# Patient Record
Sex: Male | Born: 1965 | Race: White | Hispanic: No | Marital: Single | Smoking: Former smoker
Health system: Southern US, Community
[De-identification: ages and names within clinical notes are randomized; demographics above are authoritative.]

## PROBLEM LIST (undated history)

## (undated) DIAGNOSIS — T7840XA Allergy, unspecified, initial encounter: Secondary | ICD-10-CM

## (undated) DIAGNOSIS — I10 Essential (primary) hypertension: Secondary | ICD-10-CM

## (undated) DIAGNOSIS — J45909 Unspecified asthma, uncomplicated: Secondary | ICD-10-CM

## (undated) DIAGNOSIS — M1990 Unspecified osteoarthritis, unspecified site: Secondary | ICD-10-CM

## (undated) DIAGNOSIS — G4700 Insomnia, unspecified: Secondary | ICD-10-CM

## (undated) DIAGNOSIS — G4730 Sleep apnea, unspecified: Secondary | ICD-10-CM

## (undated) DIAGNOSIS — F419 Anxiety disorder, unspecified: Secondary | ICD-10-CM

## (undated) DIAGNOSIS — G4733 Obstructive sleep apnea (adult) (pediatric): Secondary | ICD-10-CM

## (undated) DIAGNOSIS — J309 Allergic rhinitis, unspecified: Secondary | ICD-10-CM

## (undated) DIAGNOSIS — L309 Dermatitis, unspecified: Secondary | ICD-10-CM

## (undated) DIAGNOSIS — Z9989 Dependence on other enabling machines and devices: Secondary | ICD-10-CM

## (undated) HISTORY — DX: Allergic rhinitis, unspecified: J30.9

## (undated) HISTORY — DX: Essential (primary) hypertension: I10

## (undated) HISTORY — DX: Sleep apnea, unspecified: G47.30

## (undated) HISTORY — DX: Obstructive sleep apnea (adult) (pediatric): Z99.89

## (undated) HISTORY — PX: SINOSCOPY: SHX187

## (undated) HISTORY — PX: OTHER SURGICAL HISTORY: SHX169

## (undated) HISTORY — PX: COLONOSCOPY: SHX174

## (undated) HISTORY — DX: Anxiety disorder, unspecified: F41.9

## (undated) HISTORY — DX: Dermatitis, unspecified: L30.9

## (undated) HISTORY — DX: Allergy, unspecified, initial encounter: T78.40XA

## (undated) HISTORY — PX: NOSE SURGERY: SHX723

## (undated) HISTORY — DX: Insomnia, unspecified: G47.00

## (undated) HISTORY — DX: Unspecified osteoarthritis, unspecified site: M19.90

## (undated) HISTORY — DX: Obstructive sleep apnea (adult) (pediatric): G47.33

---

## 2000-07-01 ENCOUNTER — Encounter: Admission: RE | Admit: 2000-07-01 | Discharge: 2000-07-01 | Payer: Self-pay | Admitting: Family Medicine

## 2000-07-01 ENCOUNTER — Encounter: Payer: Self-pay | Admitting: Family Medicine

## 2006-09-19 ENCOUNTER — Emergency Department (HOSPITAL_COMMUNITY): Admission: EM | Admit: 2006-09-19 | Discharge: 2006-09-19 | Payer: Self-pay | Admitting: Emergency Medicine

## 2007-09-15 ENCOUNTER — Ambulatory Visit: Payer: Self-pay | Admitting: Pulmonary Disease

## 2007-09-21 ENCOUNTER — Encounter: Payer: Self-pay | Admitting: Pulmonary Disease

## 2007-09-21 ENCOUNTER — Ambulatory Visit (HOSPITAL_BASED_OUTPATIENT_CLINIC_OR_DEPARTMENT_OTHER): Admission: RE | Admit: 2007-09-21 | Discharge: 2007-09-21 | Payer: Self-pay | Admitting: Pulmonary Disease

## 2007-10-05 ENCOUNTER — Ambulatory Visit: Payer: Self-pay | Admitting: Pulmonary Disease

## 2007-10-06 ENCOUNTER — Ambulatory Visit: Payer: Self-pay | Admitting: Internal Medicine

## 2007-10-06 DIAGNOSIS — G4700 Insomnia, unspecified: Secondary | ICD-10-CM | POA: Insufficient documentation

## 2007-10-06 DIAGNOSIS — E7800 Pure hypercholesterolemia, unspecified: Secondary | ICD-10-CM | POA: Insufficient documentation

## 2007-10-06 DIAGNOSIS — F17200 Nicotine dependence, unspecified, uncomplicated: Secondary | ICD-10-CM | POA: Insufficient documentation

## 2007-10-06 DIAGNOSIS — E291 Testicular hypofunction: Secondary | ICD-10-CM | POA: Insufficient documentation

## 2007-10-06 LAB — CONVERTED CEMR LAB
ALT: 61 units/L — ABNORMAL HIGH (ref 0–53)
AST: 37 units/L (ref 0–37)
Albumin: 4.1 g/dL (ref 3.5–5.2)
Alkaline Phosphatase: 72 units/L (ref 39–117)
Amphetamine Screen, Ur: NEGATIVE
BUN: 7 mg/dL (ref 6–23)
Bacteria, UA: NEGATIVE
Barbiturate Quant, Ur: NEGATIVE
Basophils Absolute: 0.1 10*3/uL (ref 0.0–0.1)
Basophils Relative: 0.6 % (ref 0.0–3.0)
Benzodiazepines.: NEGATIVE
Bilirubin Urine: NEGATIVE
Bilirubin, Direct: 0.1 mg/dL (ref 0.0–0.3)
CO2: 29 meq/L (ref 19–32)
Calcium: 9.1 mg/dL (ref 8.4–10.5)
Chloride: 104 meq/L (ref 96–112)
Cocaine Metabolites: NEGATIVE
Creatinine, Ser: 0.9 mg/dL (ref 0.4–1.5)
Creatinine,U: 201.7 mg/dL
Crystals: NEGATIVE
Eosinophils Absolute: 0.1 10*3/uL (ref 0.0–0.7)
Eosinophils Relative: 0.5 % (ref 0.0–5.0)
GFR calc Af Amer: 118 mL/min
GFR calc non Af Amer: 97 mL/min
Glucose, Bld: 94 mg/dL (ref 70–99)
HCT: 45.7 % (ref 39.0–52.0)
Hemoglobin, Urine: NEGATIVE
Hemoglobin: 16.3 g/dL (ref 13.0–17.0)
Ketones, ur: NEGATIVE mg/dL
Leukocytes, UA: NEGATIVE
Lymphocytes Relative: 30.3 % (ref 12.0–46.0)
MCHC: 35.8 g/dL (ref 30.0–36.0)
MCV: 93.7 fL (ref 78.0–100.0)
Marijuana Metabolite: NEGATIVE
Methadone: NEGATIVE
Monocytes Absolute: 0.5 10*3/uL (ref 0.1–1.0)
Monocytes Relative: 4.9 % (ref 3.0–12.0)
Neutro Abs: 6.5 10*3/uL (ref 1.4–7.7)
Neutrophils Relative %: 63.7 % (ref 43.0–77.0)
Nitrite: NEGATIVE
Opiates: NEGATIVE
Phencyclidine (PCP): NEGATIVE
Platelets: 325 10*3/uL (ref 150–400)
Potassium: 4 meq/L (ref 3.5–5.1)
Propoxyphene: NEGATIVE
RBC: 4.88 M/uL (ref 4.22–5.81)
RDW: 12.1 % (ref 11.5–14.6)
Sodium: 142 meq/L (ref 135–145)
Specific Gravity, Urine: 1.02 (ref 1.000–1.03)
Squamous Epithelial / HPF: NEGATIVE /lpf
TSH: 0.97 microintl units/mL (ref 0.35–5.50)
Total Bilirubin: 0.9 mg/dL (ref 0.3–1.2)
Total Protein, Urine: NEGATIVE mg/dL
Total Protein: 7.7 g/dL (ref 6.0–8.3)
Urine Glucose: NEGATIVE mg/dL
Urobilinogen, UA: 0.2 (ref 0.0–1.0)
WBC: 10.4 10*3/uL (ref 4.5–10.5)
pH: 7 (ref 5.0–8.0)

## 2007-10-07 ENCOUNTER — Encounter: Payer: Self-pay | Admitting: Internal Medicine

## 2007-10-09 DIAGNOSIS — F3289 Other specified depressive episodes: Secondary | ICD-10-CM | POA: Insufficient documentation

## 2007-10-09 DIAGNOSIS — F329 Major depressive disorder, single episode, unspecified: Secondary | ICD-10-CM | POA: Insufficient documentation

## 2007-10-14 ENCOUNTER — Ambulatory Visit: Payer: Self-pay | Admitting: Licensed Clinical Social Worker

## 2007-10-19 ENCOUNTER — Encounter: Payer: Self-pay | Admitting: Internal Medicine

## 2007-10-19 DIAGNOSIS — R748 Abnormal levels of other serum enzymes: Secondary | ICD-10-CM | POA: Insufficient documentation

## 2007-10-20 ENCOUNTER — Ambulatory Visit: Payer: Self-pay | Admitting: Internal Medicine

## 2007-10-20 LAB — CONVERTED CEMR LAB
ALT: 71 units/L — ABNORMAL HIGH (ref 0–53)
AST: 45 units/L — ABNORMAL HIGH (ref 0–37)
Albumin: 3.9 g/dL (ref 3.5–5.2)
Alkaline Phosphatase: 71 units/L (ref 39–117)
Bilirubin, Direct: 0.1 mg/dL (ref 0.0–0.3)
Cholesterol, target level: 200 mg/dL
HCV Ab: NEGATIVE
HDL goal, serum: 40 mg/dL
Hep A Total Ab: NEGATIVE
Hep B Core Total Ab: NEGATIVE
Hep B S Ab: NEGATIVE
Hepatitis B Surface Ag: NEGATIVE
LDL Goal: 160 mg/dL
Total Bilirubin: 0.8 mg/dL (ref 0.3–1.2)
Total Protein: 7.6 g/dL (ref 6.0–8.3)

## 2007-10-22 ENCOUNTER — Encounter: Payer: Self-pay | Admitting: Internal Medicine

## 2007-10-26 ENCOUNTER — Ambulatory Visit: Payer: Self-pay | Admitting: Internal Medicine

## 2007-10-26 ENCOUNTER — Telehealth (INDEPENDENT_AMBULATORY_CARE_PROVIDER_SITE_OTHER): Payer: Self-pay | Admitting: *Deleted

## 2007-11-02 ENCOUNTER — Encounter: Admission: RE | Admit: 2007-11-02 | Discharge: 2007-11-02 | Payer: Self-pay | Admitting: Internal Medicine

## 2007-11-02 ENCOUNTER — Encounter: Payer: Self-pay | Admitting: Internal Medicine

## 2007-11-15 ENCOUNTER — Ambulatory Visit: Payer: Self-pay | Admitting: Pulmonary Disease

## 2007-11-15 DIAGNOSIS — G4733 Obstructive sleep apnea (adult) (pediatric): Secondary | ICD-10-CM | POA: Insufficient documentation

## 2007-11-16 ENCOUNTER — Ambulatory Visit: Payer: Self-pay | Admitting: Internal Medicine

## 2007-12-09 ENCOUNTER — Encounter: Payer: Self-pay | Admitting: Pulmonary Disease

## 2008-01-30 ENCOUNTER — Telehealth (INDEPENDENT_AMBULATORY_CARE_PROVIDER_SITE_OTHER): Payer: Self-pay | Admitting: *Deleted

## 2008-02-28 ENCOUNTER — Ambulatory Visit: Payer: Self-pay | Admitting: Pulmonary Disease

## 2008-04-16 ENCOUNTER — Ambulatory Visit: Payer: Self-pay | Admitting: Pulmonary Disease

## 2008-04-16 ENCOUNTER — Ambulatory Visit: Payer: Self-pay | Admitting: Internal Medicine

## 2008-04-16 DIAGNOSIS — M545 Low back pain, unspecified: Secondary | ICD-10-CM | POA: Insufficient documentation

## 2008-04-16 LAB — CONVERTED CEMR LAB
ALT: 60 units/L — ABNORMAL HIGH (ref 0–53)
AST: 38 units/L — ABNORMAL HIGH (ref 0–37)
Albumin: 3.9 g/dL (ref 3.5–5.2)
Alkaline Phosphatase: 66 units/L (ref 39–117)
Bilirubin, Direct: 0.2 mg/dL (ref 0.0–0.3)
Testosterone: 266.67 ng/dL — ABNORMAL LOW (ref 350.00–890.00)
Total Bilirubin: 0.8 mg/dL (ref 0.3–1.2)
Total Protein: 7.6 g/dL (ref 6.0–8.3)

## 2008-04-20 ENCOUNTER — Telehealth: Payer: Self-pay | Admitting: Internal Medicine

## 2008-04-25 ENCOUNTER — Telehealth: Payer: Self-pay | Admitting: Internal Medicine

## 2008-05-17 ENCOUNTER — Ambulatory Visit: Payer: Self-pay | Admitting: Pulmonary Disease

## 2008-05-18 ENCOUNTER — Ambulatory Visit: Payer: Self-pay | Admitting: Internal Medicine

## 2008-07-20 ENCOUNTER — Ambulatory Visit: Payer: Self-pay | Admitting: Pulmonary Disease

## 2008-07-26 ENCOUNTER — Telehealth: Payer: Self-pay | Admitting: Pulmonary Disease

## 2008-09-18 ENCOUNTER — Ambulatory Visit: Payer: Self-pay | Admitting: Pulmonary Disease

## 2008-09-20 ENCOUNTER — Telehealth: Payer: Self-pay | Admitting: Pulmonary Disease

## 2008-10-16 ENCOUNTER — Ambulatory Visit: Payer: Self-pay | Admitting: Pulmonary Disease

## 2008-11-22 ENCOUNTER — Encounter: Payer: Self-pay | Admitting: Internal Medicine

## 2009-02-13 ENCOUNTER — Encounter: Payer: Self-pay | Admitting: Pulmonary Disease
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
## 2010-02-04 NOTE — Letter (Signed)
Summary: Results Follow-up Letter  [facility]  [address] [place], [place] [address]   Phone: [phone]  Fax: [phone]    11/02/2007  [address] [place], [place]  [address]  Dear Richard Reyes,   The following are the results of your recent test(s):  Test       Result     Liver Ultrasound     Fatty liver disease  _________________________________________________________  Please call for an appointment as directed _________________________________________________________ _________________________________________________________ _________________________________________________________  Sincerely,  Sanda Linger MD [facility]

## 2010-02-04 NOTE — Progress Notes (Signed)
Summary: results  Phone Note Call from Patient Call back at [phone]   Caller: Patient Call For: sood Summary of Call: pt calling for sleep study results Initial call taken by: Rickard Patience,  October 26, 2007 2:30 PM  Follow-up for Phone Call        VS, do you have these results yet? thanks.  Follow-up by: Reynaldo Minium CMA,  October 26, 2007 3:11 PM  Additional Follow-up for Phone Call Additional follow up Details #1::        Pt needs ov to discuss results of sleep study. Michel Bickers CMA  October 26, 2007 3:22 PM  OV w/ Craige Cotta on 11-04-07 to review sleep results. Michel Bickers [facility]  October 26, 2007 4:03 PM

## 2010-02-04 NOTE — Progress Notes (Signed)
Summary: REFERRAL  Phone Note Call from Patient Call back at [phone]   Caller: Patient Call For: Giavonna Pflum Summary of Call: PT NEED TO SPEAK TO NURSE ABOUT BEING REFERRED TO ANOTHER SLEEP DR Initial call taken by: Rickard Patience,  September 20, 2008 1:48 PM  Follow-up for Phone Call        Pt says the Alfonso Patten did not help him last night and Dr. Craige Cotta had said he would refer him elsewhere. He is going to callback with the name of another sleep doctor that does a special therapy, so we may ask Dr. Craige Cotta what he thinks. If this is not appropriate for him, he is willing to be referred to [facility] or [facility]. Michel Bickers [facility]  September 20, 2008 3:40 PM  Additional Follow-up for Phone Call Additional follow up Details #1::        will await call bcak. Carron Curie CMA  September 21, 2008 9:46 AM

## 2010-02-04 NOTE — Progress Notes (Signed)
Summary: talk to [facility] x1  Phone Note Call from Patient Call back at Work Phone [phone]   Caller: Patient Call For: SOOD Reason for Call: Talk to Doctor Summary of Call: patient wants to talk to dr sood about the meds that he is on. he is having some of the side effects that he read about from taking some of the meds. he said he feels like a "druggie".  Initial call taken by: Valinda Hoar,  July 26, 2008 1:05 PM  Follow-up for Phone Call        [facility] Vernie Murders  July 26, 2008 2:01 PM  Spoke with pt.  He states that he is afraid to start taking clonazepam due to side effects that he read about.  He states that he read about having anxiety, hair loss, and dizziness.  He states that he already has these problems and does not want to make them worse.  Has not tried taking med yet.  Wants to try something with less s/e.  Please advise thanks!  Follow-up by: Vernie Murders,  July 26, 2008 3:10 PM  Additional Follow-up for Phone Call Additional follow up Details #1::        Explained to patient that the side effects he was concerned about while possible are not very common.  He was concerned about hair loss, dry mouth, blurred vision.  I advised him to try using the medicine, and if he notices any side effects to call me for further instructions. Additional Follow-up by: Coralyn Helling MD,  July 26, 2008 3:48 PM

## 2010-02-04 NOTE — Letter (Signed)
Summary: CMN for [facility]AP [facility]  CMN for [facility]   Imported By: Lennie Odor 02/18/2009 14:23:59  _____________________________________________________________________  External Attachment:    Type:   Image     Comment:   External Document

## 2010-02-04 NOTE — Assessment & Plan Note (Signed)
Summary: 2 week fu/$50/jss      Current Allergies: ! SULFA        Complete Medication List: 1)  Trazodone Hcl 50 Mg Tabs (Trazodone hcl) .... Take one tablet at bedtime 2)  Lunesta 3 Mg Tabs (Eszopiclone) 3)  Restoril 30 Mg Caps (Temazepam) 4)  Doxycycline Hyclate 100 Mg Tabs (Doxycycline hyclate) 5)  Symbyax 6-25 Mg Caps (Olanzapine-fluoxetine hcl) .... Once daily at 7pm    ]

## 2010-02-04 NOTE — Progress Notes (Signed)
Summary: Medication  Phone Note Call from Patient   Summary of Call: See previous phone note. Pt is willing to trying to a different medication. Would you like to add new med?  Initial call taken by: Lamar Sprinkles,  April 25, 2008 9:45 AM  Follow-up for Phone Call        I need the pt. to tell me about all of the previous anti-depressants he has taken and how he responded to each Follow-up by: Etta Grandchild MD,  April 25, 2008 9:48 AM  Additional Follow-up for Phone Call Additional follow up Details #1::        left mess to call office back.......................Marland KitchenLamar Sprinkles  May 02, 2008 6:27 PM     Additional Follow-up for Phone Call Additional follow up Details #2::    Multiple attempts, no call back, will sign off on phone note Follow-up by: Lamar Sprinkles,  May 07, 2008 5:54 PM

## 2010-02-04 NOTE — Assessment & Plan Note (Signed)
Summary: rov/discuss sleep results/apc   PCP:  Sanda Linger  Chief Complaint:  Follow up sleep study.  History of Present Illness: I saw Richard Reyes in follow up today to review his sleep test.  This was done on September 21, 2007.  This showed evidence for mild to moderate sleep apnea with an AHI of 14 and oxygen saturation nadir of 86%.  Of note is that he took at total of 15 mg of lunesta on the night of the study.     Current Allergies: ! SULFA  Past Medical History:    Dyslipidemia - diet controlled    Depression    Low testosterone    OSA, PSG 09/21/07 AHI 14      Vital Signs:  Patient Profile:   45 Years Old Male Height:     69 inches Weight:      215 pounds O2 Sat:      95 % O2 treatment:    Room Air Temp:     98.1 degrees F oral Pulse rate:   104 / minute BP sitting:   110 / 60  (left arm)  Vitals Entered By: Renold Genta RCP, LPN (November 15, 2007 3:18 PM)             Is Patient Diabetic? No Comments Follow up sleep study results, pt os still smoking 4 per day Medications reviewed with patient Renold Genta RCP, LPN  November 15, 2007 3:25 PM          Impression & Recommendations:  Problem # 1:  OBSTRUCTIVE SLEEP APNEA (ICD-327.23) I reviewed his sleep test results.  The health consequences of untreated sleep apnea were explained.  The importance of driving precautions and weight reduction were reviewed.  I discussed the treatment options for sleep apnea.  He will start on auto-CPAP.  Depending on his response to this he may need to have an in-lab titration study.   Problem # 2:  INSOMNIA (ICD-780.52) He is taking several sleep aides, and is worried about the potential long term effects, as well as possible addictive potential.  He however is worried that he will still need something to help him initiate and maintain sleep while he is weaning of his other sleep aides.  I will start him on zolpidem 10 mg at bedtime as needed.  He is to then  decrease his dose of restoril to 15 mg at bedtime.  Once he is okay with this regimen, then he will further decrease the dose of restoril to 15 mg every other day while using ambien as needed.  After becoming established on this pattern he will then stop using restoril, and only use ambien as needed.  We will then work on weaning him off of [name].  Problem # 3:  DEPRESSION (ICD-311) He is concerned about the way Symbyax is making him feel.  I advised him to follow up with his primary care physician to determine if adjustments to his psychiatric medications are needed.  Medications Added to Medication List This Visit: 1)  Restoril 30 Mg Caps (Temazepam) .Marland Kitchen.. 1 capsule at bedtime as needed 2)  Trazamine 50 Mg Misc (Trazodone & diet manage prod) .Marland Kitchen.. 1-2 tablets as needed at bedtime 3)  Zolpidem Tartrate 10 Mg Tabs (Zolpidem tartrate) .... One by mouth at bedtime as needed  Complete Medication List: 1)  Symbyax 6-25 Mg Caps (Olanzapine-fluoxetine hcl) .... Once daily at 7pm 2)  Restoril 30 Mg Caps (Temazepam) .Marland Kitchen.. 1 capsule at bedtime as needed  3)  Trazamine 50 Mg Misc (Trazodone & diet manage prod) .Marland Kitchen.. 1-2 tablets as needed at bedtime 4)  Zolpidem Tartrate 10 Mg Tabs (Zolpidem tartrate) .... One by mouth at bedtime as needed   Patient Instructions: 1)  Ambien/Zolpidem 10 mg at bedtime as needed 2)  Will set up CPAP machine at home 3)  Follow up in 8 weeks   Prescriptions: ZOLPIDEM TARTRATE 10 MG TABS (ZOLPIDEM TARTRATE) one by mouth at bedtime as needed  #30 x 0   Entered and Authorized by:   Coralyn Helling MD   Signed by:   Coralyn Helling MD on 11/15/2007   Method used:   Print then Give to Patient   RxID:   [id number]  ]
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
## 2010-02-04 NOTE — Letter (Signed)
Summary: [facility]  [facility]   Imported By: Sherian Rein 12/07/2008 09:19:35  _____________________________________________________________________  External Attachment:    Type:   Image     Comment:   External Document

## 2010-02-04 NOTE — Letter (Signed)
Summary: Results Follow-up Letter  [facility]  [address] [place], [place] [address]   Phone: [phone]  Fax: [phone]    04/16/2008  [address] [place], [place]  [address]  Dear Mr. Hargadon,   The following are the results of your recent test(s):  Test     Result     Liver enzymes   mildly elevated Testosterone   low   _________________________________________________________  Please call for an appointment as directed _________________________________________________________ _________________________________________________________ _________________________________________________________  Sincerely,  Sanda Linger MD [facility]

## 2010-02-04 NOTE — Letter (Signed)
Summary: Results Follow-up Letter  [facility]  [address] [place], [place] [address]   Phone: [phone]  Fax: [phone]    10/22/2007  [address] [place], [place]  [address]  Dear Richard Reyes,   The following are the results of your recent test(s):  Test     Result     the liver enzymes remain elevated. Actually they're a little higher than last time. All the tests for viral forms of hepatitis including A, B, and C are negative. This is good news. However, it doesmean that you are at risk of contracting hepatitis A, B and C. We have a vaccine for A and B which I would recommend you start as soon as possible. We don't have a vaccine for hepatitis C.   _________________________________________________________  Please call for an appointment soon _________________________________________________________ _________________________________________________________ _________________________________________________________  Sincerely,  Sanda Linger MD [facility]

## 2010-02-04 NOTE — Progress Notes (Signed)
Summary: DEPRESSION  Phone Note Outgoing Call    Follow-up for Phone Call        I got a note from Dr. Craige Cotta that Mr. Richard Reyes is depressed, please ask him to restart the Symbyax Follow-up by: Etta Grandchild MD,  April 20, 2008 12:45 PM  Additional Follow-up for Phone Call Additional follow up Details #1::        left mess to call office back .................Marland KitchenLamar Sprinkles  April 20, 2008 3:57 PM   no answer on hm #, left mess to call office back on work #.........................Marland KitchenLamar Sprinkles  April 23, 2008 4:02 PM     Additional Follow-up for Phone Call Additional follow up Details #2::    Patient returned call and was given MD advisement.  He states that the medication was d/c in the past due to side effects and knee pain. Follow-up by: Rock Nephew CMA,  April 24, 2008 3:35 PM  New/Updated Medications: SYMBYAX 6-25 MG CAPS (OLANZAPINE-FLUOXETINE HCL) one qday at about 7;30 pm   Prescriptions: SYMBYAX 6-25 MG CAPS (OLANZAPINE-FLUOXETINE HCL) one qday at about 7;30 pm  #30 x 9   Entered and Authorized by:   Etta Grandchild MD   Signed by:   Etta Grandchild MD on 04/20/2008   Method used:   Electronically to        [facility]  [id number]* (retail)       [address]       [place], [place]  [address]       Ph: [phone] or [phone]       Fax: [phone]   RxID:   [id number]
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
## 2010-02-04 NOTE — Assessment & Plan Note (Signed)
Summary: 1 onth return/mh   Primary Provider/Referring Provider:  Sanda Linger  CC:  1 month follow-up visit.  Pt states sleeping some better and but still not sleeping well.  Still has difficulty falling asleep with CPAP on.  CPAP makes a whistling sound sometimes at night.  .  History of Present Illness: I saw Richard Reyes in follow up for his OSA and insomnia.  He goes to bed at midnight, and wakes up at 4 am.  He has tried experimenting with different combinations of lorazepam and trazodone.  He is sleeping slightly better.  He is not sleeping during the day.  He has been tolerating CPAP better.  His mask has been making a whistling noise.  He feels his thoughts are clear, but he is still forgetful at times.  He gets yellow color in his eyes in the morning, but goes away during the day.   Current Medications (verified): 1)  Trazodone Hcl 150 Mg Tabs (Trazodone Hcl) .... One By Mouth At Bedtime As Needed 2)  Lorazepam 1 Mg Tabs (Lorazepam) .... One By Mouth At Bedtime As Needed  Allergies (verified): 1)  ! Sulfa  Vital Signs:  Patient profile:   45 year old male Weight:      209.25 pounds O2 Sat:      94 % Temp:     98.0 degrees F oral Pulse rate:   80 / minute BP sitting:   114 / 74  (left arm)  Vitals Entered By: Richard Reams RN (May 17, 2008 9:19 AM)  O2 Sat at Rest %:  94 O2 Flow:  room air  Physical Exam  Eyes:  no scleral icterus   Impression & Recommendations:  Problem # 1:  OBSTRUCTIVE SLEEP APNEA (ICD-327.23) He seems to be tolerating CPAP better.  He will contact his DME about checking his mask fit.  I will get a copy of his CPAP download, and call him with the results.  Problem # 2:  INSOMNIA (ICD-780.52) His sleep pattern seems to be improved some.  He is to continue on lorazepam and trazodone for now.  Once his sleep pattern is stable, will then try to work on tapering down his sleep aide regimen.  Complete Medication List: 1)  Trazodone Hcl 150 Mg  Tabs (Trazodone hcl) .... One by mouth at bedtime as needed 2)  Lorazepam 1 Mg Tabs (Lorazepam) .... One by mouth at bedtime as needed  Other Orders: DME Referral (DME) Est. Patient Level II (16109)  Patient Instructions: 1)  Follow up in 3 to 4 months
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
## 2010-02-04 NOTE — Progress Notes (Signed)
Summary: Ambien refill  Phone Note Call from Patient Call back at [phone]   Summary of Call: [facility] needs refill to last until appt on 2/16 with dr sood had to resc due to dr sood emergancy  Initial call taken by: Lacinda Axon,  January 30, 2008 12:58 PM  Follow-up for Phone Call        Per Chantel pt needs refill on Zolpidem 10mg  tablets. See electronic rx.  Called in refill to pharmacy as requested.  Attempted to notify pt.  LMOM rx refill sent to pharmacy.  Follow-up by: Cloyde Reams RN,  January 30, 2008 3:43 PM      Prescriptions: ZOLPIDEM TARTRATE 10 MG TABS (ZOLPIDEM TARTRATE) one by mouth at bedtime as needed  #30 x 0   Entered by:   Cloyde Reams RN   Authorized by:   Coralyn Helling MD   Signed by:   Cloyde Reams RN on 01/30/2008   Method used:   Telephoned to ...       [facility] [id number] (retail)       [address]       [place], [place]  [address]       Ph: [phone] or [phone]       Fax: [phone]   RxID:   [id number]

## 2010-02-04 NOTE — Letter (Signed)
Summary: Results Follow-up Letter  [facility]  [address] [place], [place] [address]   Phone: [phone]  Fax: [phone]    10/07/2007  [address] [place], [place]  [address]  Dear Mr. Wachter,   The following are the results of your recent test(s):  Test     Result     complete blood count   normal Kidney function     normal Liver enzymes     very mildly elevated Thyroid function     normal Urinalysis       normal   _________________________________________________________  Please call for an appointment in 2 weeks _________________________________________________________ _________________________________________________________ _________________________________________________________  Sincerely,  Sanda Linger MD [facility]
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
## 2010-02-04 NOTE — Assessment & Plan Note (Signed)
Summary: ROV/MBW   PCP:  Richard Reyes  Chief Complaint:  fu visit.....Marland Kitchenambien not working...Marland KitchenMarland KitchenMarland Kitchenreviewed meds.  History of Present Illness: I saw Richard Reyes in follow up for his OSA and insomnia.  He is currently using a full face mask with a humidifer.  He gets dryness in his nose and mouth.  He uses the machine for about 4 hours.  It will some times come off in the middle of the night.  He is not sure if it helps.  He has not been using his machine for the past several days because he just had dental work.  He has unusual behaviors when he uses [name].  He says that he will call people, smoke, drink alcohol, and do other activities with being aware of this when he takes [name].  He is not aware of these when they occur, but finds out later from others.  He goes to bed at 930 pm.  He will sometimes take symbyax, but this does not help him fall asleep.  He wakes up between 5 and 7 am.  He says that if he does not use anything to sleep he will stay awake for a week.  Interestingly, he says his mother has told him that he sleeps more than he realizes.     Current Allergies: ! SULFA Past Medical History:    Dyslipidemia - diet controlled    Depression    Low testosterone    OSA, PSG 09/21/07 AHI 14    Insomnia   Vital Signs:  Patient Profile:   45 Years Old Male Height:     69 inches Weight:      213.25 pounds BMI:     31.61 O2 Sat:      95 % O2 treatment:    Room Air Temp:     98.2 degrees F oral Pulse rate:   83 / minute BP sitting:   120 / 70  (left arm) Cuff size:   regular  Pt. in pain?   no  Vitals Entered By: Richard Reyes) (February 28, 2008 4:35 PM)                  Impression & Recommendations:  Problem # 1:  OBSTRUCTIVE SLEEP APNEA (ICD-327.23) He will speak with his DME about his mask fit, and adjusting his humidifer.  He will also drop off his CPAP download, and then will decide if further adjustments needed. Orders: Est. Patient Level II  (98119)   Problem # 2:  INSOMNIA (ICD-780.52) He had confusional arousals with ambien.  He says he is no longer using restoril.  He uses symbyax, but only sporadically.  I will start him on trazodone 150 mg at bedtime.  I have discussed with him the possible side effects of trazodone. Orders: Est. Patient Level II (14782)   Medications Added to Medication List This Visit: 1)  Trazodone Hcl 150 Mg Tabs (Trazodone hcl) .... One by mouth at bedtime as needed  Complete Medication List: 1)  Symbyax 6-25 Mg Caps (Olanzapine-fluoxetine hcl) .... Once daily at 7pm 2)  Trazodone Hcl 150 Mg Tabs (Trazodone hcl) .... One by mouth at bedtime as needed  Patient Instructions: 1)  Trazodone 150 mg at bedtime as needed 2)  Follow up in 6 weeks Prescriptions: TRAZODONE HCL 150 MG TABS (TRAZODONE HCL) one by mouth at bedtime as needed  #30 x 2   Entered and Authorized by:   Richard Helling MD   Signed by:   Richard Nancy  Nyiah Pianka MD on 02/28/2008   Method used:   Electronically to        [id number] (retail)       [address]       [place], [place]  [address]       Ph: [phone] or [phone]       Fax: [phone]   RxID:   [id number]

## 2010-05-20 NOTE — Procedures (Signed)
NAME:  Richard Reyes, Richard Reyes              ACCOUNT NO.:  [id number]   MEDICAL RECORD NO.:  [id number]          PATIENT TYPE:  OUT   LOCATION:  SLEEP CENTER                 FACILITY[facility]  [facility]   PHYSICIAN:  Coralyn Helling, MD        DATE OF BIRTH:  Oct 13, 1965   DATE OF STUDY:  09/21/2007                            NOCTURNAL POLYSOMNOGRAM   REFERRING PHYSICIAN:   REFERRING PHYSICIAN:  Coralyn Helling, M.D.   INDICATION FOR STUDY:  [name] is a 45 year old male who has a  history of sleep onset and sleep maintenance insomnia with sleep  disruption and excessive daytime sleepiness.  He is referred to the  sleep lab for evaluation of this as well as also to evaluate obstructive  sleep apnea.   Height is 5 feet 9 inches.  Weight is 203 pounds.  BMI is 30.  Neck size  is 15 inches.   EPWORTH SLEEPINESS SCORE:  2.   MEDICATIONS:  Trazodone and Lunesta.  The patient took one Lunesta 3 mg  at 8:45 p.m., then took a total of 6 mg of Lunesta at 9:30 p.m.  Then  another 6 mg of Lunesta at 10:30 p.m.   SLEEP ARCHITECTURE:  Total recording time was 439 minutes.  Total sleep  time was 280 minutes.  Sleep efficiency was 64%, which is reduced.  Sleep latency was 24 minutes, which is prolonged.  REM latency was 227  minutes, which is prolonged.  The study was notable for lack of slow  wave sleep, and he slept predominantly in the nonsupine position.  Of  note is that it appeared that he had difficulties with sleep initiation  and sleep maintenance due to respiratory events.   RESPIRATORY DATA:  The average respiratory rate was 22.  The overall  apnea-hypopnea index was 14.  There was one central apneic event.  The  remainder of the events were obstructive in nature.  The REM apnea-  hypopnea index was 30.  The non-REM apnea-hypopnea index was 10.  Moderate snoring was noted by the technician.   OXYGEN DATA:  The baseline oxygenation was 96%.  The oxygen saturation  nadir was 86%.  The patient spent a  total of 5 minutes with an oxygen  saturation of less than 90%, and 1.2 minutes with an oxygen saturation  of less than 88%.   CARDIAC DATA:  The average heart rate was 72.  The rhythm strip showed a  normal sinus rhythm.   MOVEMENT-PARASOMNIA:  The patient had two restroom trips.  The periodic  limb movement index was 1.7.   IMPRESSIONS-RECOMMENDATIONS:  This study shows evidence for mild-to-  moderate obstructive sleep apnea, as demonstrated by an apnea-hypopnea  index of 14 and an oxygen saturation nadir of 86%.  Of note is that he  appeared to have difficulty with sleep initiation and sleep maintenance  due to respiratory events.   In addition to counseling with regards to the importance of valid  exercise and weight reduction, further therapeutic interventions could  include either CPAP therapy, oral appliance, or surgical intervention.   The patient also should receive further counseling with regards to the  proper  use of sleep aids.      Coralyn Helling, MD  Diplomat, American Board of Sleep Medicine  Electronically Signed     VS/MEDQ  D:  10/05/2007 17:34:24  T:  10/05/2007 19:17:45  Job:  [id number]

## 2013-05-17 ENCOUNTER — Ambulatory Visit (INDEPENDENT_AMBULATORY_CARE_PROVIDER_SITE_OTHER): Payer: BC Managed Care – PPO | Admitting: Family Medicine

## 2013-05-17 VITALS — BP 122/90 | HR 106 | Temp 98.0°F | Resp 18 | Ht 68.75 in | Wt 211.0 lb

## 2013-05-17 DIAGNOSIS — Z733 Stress, not elsewhere classified: Secondary | ICD-10-CM

## 2013-05-17 DIAGNOSIS — F439 Reaction to severe stress, unspecified: Secondary | ICD-10-CM

## 2013-05-17 DIAGNOSIS — G4700 Insomnia, unspecified: Secondary | ICD-10-CM

## 2013-05-17 MED ORDER — CLONAZEPAM 0.5 MG PO TABS: 0.5000 mg | ORAL_TABLET | Freq: Two times a day (BID) | ORAL | Status: DC | PRN

## 2013-05-17 NOTE — Progress Notes (Signed)
Chief Complaint:  Chief Complaint  Patient presents with  . Insomnia    pt wants a referral for a sleep study    HPI: Richard Reyes is a 48 y.o. male who is here for insomnia sxs, he states that in the past he had lunesta and it worked well for him until it stopped working and had the opposite affect and he ended up not sleeping. He never had roblems with going to sleep or maintaining sleep per the patient, he had problesm with  He recently was let go and is gittery and worried about payingth ebillsm has incurance for the next 3 months. He has a couple of lorazepam that he had from an urgent care [facility] [place] and also sleep issues  NO SI/HI/halluciantions/bipolar disorder  Past Medical History  Diagnosis Date  . Anxiety    History reviewed. No pertinent past surgical history. History   Social History  . Marital Status: Single    Spouse Name: N/A    Number of Children: N/A  . Years of Education: N/A   Social History Main Topics  . Smoking status: Former Smoker    Quit date: 05/18/2007  . Smokeless tobacco: None  . Alcohol Use: No  . Drug Use: No  . Sexual Activity: None   Other Topics Concern  . None   Social History Narrative  . None   Family History  Problem Relation Age of Onset  . Cancer Maternal Grandmother    Allergies  Allergen Reactions  . Sulfonamide Derivatives    Prior to Admission medications   Medication Sig Start Date End Date Taking? Authorizing Provider  fluconazole (DIFLUCAN) 200 MG tablet Take 200 mg by mouth daily.   Yes Historical Provider, MD  LORazepam (ATIVAN) 0.5 MG tablet Take 0.5 mg by mouth every 8 (eight) hours.   Yes Historical Provider, MD     ROS: The patient denies fevers, chills, night sweats, unintentional weight loss, chest pain, palpitations, wheezing, dyspnea on exertion, nausea, vomiting, abdominal pain, dysuria, hematuria, melena, numbness, weakness, or tingling.   All other systems  have been reviewed and were otherwise negative with the exception of those mentioned in the HPI and as above.    PHYSICAL EXAM: Filed Vitals:   05/17/13 1117  BP: 122/90  Pulse: 106  Temp: 98 F (36.7 C)  Resp: 18   Filed Vitals:   05/17/13 1117  Height: 5' 8.75" (1.746 m)  Weight: 211 lb (95.709 kg)   Body mass index is 31.4 kg/(m^2).  General: Alert, no acute distress, talkative white male HEENT:  Normocephalic, atraumatic, oropharynx patent. EOMI, PERRLA Cardiovascular:  Regular rate and rhythm, no rubs murmurs or gallops.  No Carotid bruits, radial pulse intact. No pedal edema.  Respiratory: Clear to auscultation bilaterally.  No wheezes, rales, or rhonchi.  No cyanosis, no use of accessory musculature GI: No organomegaly, abdomen is soft and non-tender, positive bowel sounds.  No masses. Skin: No rashes. Neurologic: Facial musculature symmetric. Psychiatric: Patient is appropriate throughout our interaction. Lymphatic: No cervical lymphadenopathy Musculoskeletal: Gait intact.   LABS: Results for orders placed in visit on 04/16/08  CONVERTED [facility]      Result Value Ref Range   Testosterone 266.67 (*) 350.00-890.00 ng/dL   Total Bilirubin 0.8  0.3-1.2 mg/dL   Bilirubin, Direct 0.2  0.0-0.3 mg/dL   Alkaline Phosphatase 66  39-117 units/L   AST 38 (*) 0-37 units/L   ALT 60 (*)  0-53 units/L   Total Protein 7.6  6.0-8.3 g/dL   Albumin 3.9  3.5-5.2 g/dL     EKG/XRAY:   Primary read interpreted by Dr. Marin Comment at [facility].   ASSESSMENT/PLAN: Encounter Diagnoses  Name Primary?  . Insomnia Yes  . Situational stress    Rx Klonopin F/u in 1 month for recheck and also annual exam F/u 1 month, he understnads drug policy Narcotic profile pulled and is negative  Gross sideeffects, risk and benefits, and alternatives of medications d/w patient. Patient is aware that all medications have potential sideeffects and we are unable to predict every sideeffect or drug-drug  interaction that may occur.  Glenford Bayley, DO 05/17/2013 1:38 PM

## 2013-05-18 ENCOUNTER — Telehealth: Payer: Self-pay

## 2013-05-18 NOTE — Telephone Encounter (Signed)
Pt saw Dr. Marin Comment for a referral for sleep study, his insurance runs out on Friday 5/22, would like to know if he could get this done before then, please call  [phone]

## 2013-05-19 NOTE — Telephone Encounter (Signed)
Dr Marin Comment, Please advise.

## 2013-05-20 ENCOUNTER — Ambulatory Visit (INDEPENDENT_AMBULATORY_CARE_PROVIDER_SITE_OTHER): Payer: BC Managed Care – PPO | Admitting: Family Medicine

## 2013-05-20 VITALS — BP 100/72 | HR 92 | Temp 98.2°F | Resp 18 | Ht 69.0 in | Wt 212.2 lb

## 2013-05-20 DIAGNOSIS — K602 Anal fissure, unspecified: Secondary | ICD-10-CM

## 2013-05-20 DIAGNOSIS — Q828 Other specified congenital malformations of skin: Secondary | ICD-10-CM

## 2013-05-20 DIAGNOSIS — K625 Hemorrhage of anus and rectum: Secondary | ICD-10-CM

## 2013-05-20 DIAGNOSIS — K644 Residual hemorrhoidal skin tags: Secondary | ICD-10-CM

## 2013-05-20 MED ORDER — NITROGLYCERIN 0.4 % RE OINT: TOPICAL_OINTMENT | RECTAL | Status: DC

## 2013-05-20 NOTE — Patient Instructions (Addendum)
Start twice a day miralax or fiber supplements with LOTS of water to keep the stools soft and formed. If you strain or push with a hard stool, the mucosa could re-tear and you will not get healing.  Pat and gently clean with toilet paper following a bowel movement and use warm sitz baths following bowel movements to relax the sphincter. Anal Fissure, Adult An anal fissure is a small tear or crack in the skin around the anus. Bleeding from a fissure usually stops on its own within a few minutes. However, bleeding will often reoccur with each bowel movement until the crack heals.  CAUSES   Passing large, hard stools.  Frequent diarrheal stools.  Constipation.  Inflammatory bowel disease (Crohn's disease or ulcerative colitis).  Infections.  Anal sex. SYMPTOMS   Small amounts of blood seen on your stools, on toilet paper, or in the toilet after a bowel movement.  Rectal bleeding.  Painful bowel movements.  Itching or irritation around the anus. DIAGNOSIS Your caregiver will examine the anal area. An anal fissure can usually be seen with careful inspection. A rectal exam may be performed and a short tube (anoscope) may be used to examine the anal canal. TREATMENT   You may be instructed to take fiber supplements. These supplements can soften your stool to help make bowel movements easier.  Sitz baths may be recommended to help heal the tear. Do not use soap in the sitz baths.  A medicated cream or ointment may be prescribed to lessen discomfort. HOME CARE INSTRUCTIONS   Maintain a diet high in fruits, whole grains, and vegetables. Avoid constipating foods like bananas and dairy products.  Take sitz baths as directed by your caregiver.  Drink enough fluids to keep your urine clear or pale yellow.  Only take over-the-counter or prescription medicines for pain, discomfort, or fever as directed by your caregiver. Do not take aspirin as this may increase bleeding.  Do not use  ointments containing numbing medications (anesthetics) or hydrocortisone. They could slow healing. SEEK MEDICAL CARE IF:   Your fissure is not completely healed within 3 days.  You have further bleeding.  You have a fever.  You have diarrhea mixed with blood.  You have pain.  Your problem is getting worse rather than better. MAKE SURE YOU:   Understand these instructions.  Will watch your condition.  Will get help right away if you are not doing well or get worse. Document Released: 12/22/2004 Document Revised: 03/16/2011 Document Reviewed: 06/08/2010 [facility] Patient Information 2014 [place], [place]. Hemorrhoids Hemorrhoids are swollen veins around the rectum or anus. There are two types of hemorrhoids:   Internal hemorrhoids. These occur in the veins just inside the rectum. They may poke through to the outside and become irritated and painful.  External hemorrhoids. These occur in the veins outside the anus and can be felt as a painful swelling or hard lump near the anus. CAUSES  Pregnancy.   Obesity.   Constipation or diarrhea.   Straining to have a bowel movement.   Sitting for long periods on the toilet.  Heavy lifting or other activity that caused you to strain.  Anal intercourse. SYMPTOMS   Pain.   Anal itching or irritation.   Rectal bleeding.   Fecal leakage.   Anal swelling.   One or more lumps around the anus.  DIAGNOSIS  Your caregiver may be able to diagnose hemorrhoids by visual examination. Other examinations or tests that may be performed include:   Examination  of the rectal area with a gloved hand (digital rectal exam).   Examination of anal canal using a small tube (scope).   A blood test if you have lost a significant amount of blood.  A test to look inside the colon (sigmoidoscopy or colonoscopy). TREATMENT Most hemorrhoids can be treated at home. However, if symptoms do not seem to be getting better or if you have a  lot of rectal bleeding, your caregiver may perform a procedure to help make the hemorrhoids get smaller or remove them completely. Possible treatments include:   Placing a rubber band at the base of the hemorrhoid to cut off the circulation (rubber band ligation).   Injecting a chemical to shrink the hemorrhoid (sclerotherapy).   Using a tool to burn the hemorrhoid (infrared light therapy).   Surgically removing the hemorrhoid (hemorrhoidectomy).   Stapling the hemorrhoid to block blood flow to the tissue (hemorrhoid stapling).  HOME CARE INSTRUCTIONS   Eat foods with fiber, such as whole grains, beans, nuts, fruits, and vegetables. Ask your doctor about taking products with added fiber in them (fibersupplements).  Increase fluid intake. Drink enough water and fluids to keep your urine clear or pale yellow.   Exercise regularly.   Go to the bathroom when you have the urge to have a bowel movement. Do not wait.   Avoid straining to have bowel movements.   Keep the anal area dry and clean. Use wet toilet paper or moist towelettes after a bowel movement.   Medicated creams and suppositories may be used or applied as directed.   Only take over-the-counter or prescription medicines as directed by your caregiver.   Take warm sitz baths for 15 20 minutes, 3 4 times a day to ease pain and discomfort.   Place ice packs on the hemorrhoids if they are tender and swollen. Using ice packs between sitz baths may be helpful.   Put ice in a plastic bag.   Place a towel between your skin and the bag.   Leave the ice on for 15 20 minutes, 3 4 times a day.   Do not use a donut-shaped pillow or sit on the toilet for long periods. This increases blood pooling and pain.  SEEK MEDICAL CARE IF:  You have increasing pain and swelling that is not controlled by treatment or medicine.  You have uncontrolled bleeding.  You have difficulty or you are unable to have a bowel  movement.  You have pain or inflammation outside the area of the hemorrhoids. MAKE SURE YOU:  Understand these instructions.  Will watch your condition.  Will get help right away if you are not doing well or get worse. Document Released: 12/20/1999 Document Revised: 12/09/2011 Document Reviewed: 10/27/2011 [facility] Patient Information 2014 [place].

## 2013-05-20 NOTE — Progress Notes (Signed)
   Patient ID: ALEXIS MIZUNO MRN: [id number], DOB: 03/29/1965, 48 y.o. Date of Encounter: 05/20/2013, 12:21 PM   PROCEDURE NOTE: Verbal consent obtained. Sterile technique employed.  Betadine prep per usual protocol.  Skin tags removed along bilateral neck, shoulders, axilla, and left inner thighs with ethyl chloride spray and iris scissors. Hemostasis obtained. Wounds dressed. Wound care. Patient tolerated procedure well.  Signed, Christell Faith, MHS, PA-C [facility] [place], [place] [address] [facility] 05/20/2013 12:21 PM

## 2013-05-20 NOTE — Progress Notes (Signed)
Subjective:    Patient ID: Richard Reyes, male    DOB: [birth year], 48 y.o.   MRN: [id number] Chief Complaint  Patient presents with  . skin lesions  . Rectal Bleeding    about 3 days;     HPI This chart was scribed for Shawnee Knapp, MD by Ladene Artist, ED Scribe. The patient was seen in room 10. Patient's care was started at 11:10 AM.  HPI Comments: Richard Reyes is a 48 y.o. male who presents to the [facility] complaining of multiple skin lesions. Pt reports skin tags to his L arm, neck, and groin area.  Pt also reports rectal bleeding onset yesterday morning. Pt noted bright red blood on top of stool and on toilet paper. He states that he was prescribed Klonopin to assist with sleep with his first dosage being 2 days ago. He reports exercising and noticing the blood the following morning. He denies bleeding today. He reports associated burning but denies pain with BMs. Pt has a h/o hemorrhoids but denies recently feeling them with wiping. Pt also has a h/o intermittent constipation over the past 5 years. Pt has not had a colonoscopy.   Past Medical History  Diagnosis Date  . Anxiety    Current Outpatient Prescriptions on File Prior to Visit  Medication Sig Dispense Refill  . clonazePAM (KLONOPIN) 0.5 MG tablet Take 1 tablet (0.5 mg total) by mouth 2 (two) times daily as needed for anxiety.  60 tablet  0  . fluconazole (DIFLUCAN) 200 MG tablet Take 200 mg by mouth daily.       No current facility-administered medications on file prior to visit.   Allergies  Allergen Reactions  . Sulfonamide Derivatives    Review of Systems  Gastrointestinal: Positive for constipation and blood in stool. Negative for rectal pain.  Skin: Rash: skin tags.      Objective:  BP 100/72  Pulse 92  Temp(Src) 98.2 F (36.8 C) (Oral)  Resp 18  Ht 5\' 9"  (1.753 m)  Wt 212 lb 3.2 oz (96.253 kg)  BMI 31.32 kg/m2  SpO2 95%  Physical Exam  Nursing note and vitals  reviewed. Constitutional: He appears well-developed and well-nourished. No distress.  HENT:  Head: Normocephalic and atraumatic.  Neck: Neck supple.  Pulmonary/Chest: Effort normal.  Genitourinary:  Non-inflamed, non-thrombosed skin tags from prior external hemorrhoid at 11 and 1 o'clock Newer external hemorrhoid forming at 9 o'clock Anal fissure at 6'oclock Multiple surrounding pinpoint areas of mucosal abrasions No active bleeding seen Internal anal exam: normal, no masses, no gross blood  Neurological: He is alert.  Skin: He is not diaphoretic.  Multiple skin colored, bengin, small skin tags to axillary and inguinal crease, and around neck L posterior shoulder:  erythematous pustule        Assessment & Plan:   Bright red rectal bleeding  Accessory skin tags  External hemorrhoids with complication - Plan: Ambulatory referral to General Surgery  Anal fissure - Plan: Ambulatory referral to General Surgery  Meds ordered this encounter  Medications  .  Nitroglycerin 0.4 % OINT    Sig: Apply 1 inch intra-anally q12 hrs up to 3 wks    Dispense:  30 g    Refill:  1    I personally performed the services described in this documentation, which was scribed in my presence. The recorded information has been reviewed and considered, and addended by me as needed.  Delman Cheadle, MD MPH

## 2013-05-22 ENCOUNTER — Other Ambulatory Visit: Payer: Self-pay | Admitting: Family Medicine

## 2013-05-22 DIAGNOSIS — G4700 Insomnia, unspecified: Secondary | ICD-10-CM

## 2013-05-24 ENCOUNTER — Other Ambulatory Visit: Payer: Self-pay | Admitting: Radiology

## 2013-05-24 DIAGNOSIS — G4700 Insomnia, unspecified: Secondary | ICD-10-CM

## 2013-05-25 ENCOUNTER — Encounter (INDEPENDENT_AMBULATORY_CARE_PROVIDER_SITE_OTHER): Payer: Self-pay | Admitting: General Surgery

## 2013-05-25 ENCOUNTER — Ambulatory Visit (INDEPENDENT_AMBULATORY_CARE_PROVIDER_SITE_OTHER): Payer: BC Managed Care – PPO | Admitting: General Surgery

## 2013-05-25 VITALS — BP 127/88 | HR 77 | Temp 98.6°F | Resp 16 | Ht 69.0 in | Wt 210.8 lb

## 2013-05-25 DIAGNOSIS — K644 Residual hemorrhoidal skin tags: Secondary | ICD-10-CM

## 2013-05-25 DIAGNOSIS — K602 Anal fissure, unspecified: Secondary | ICD-10-CM

## 2013-05-25 NOTE — Progress Notes (Signed)
Patient ID: Richard Reyes, male   DOB: 01-14-1965, 48 y.o.   MRN: [id number]  Chief Complaint  Patient presents with  . Rectal Problems    HPI Richard Reyes is a 48 y.o. male.   HPI 48 year old Caucasian male referred by Dr. Brigitte Pulse for evaluation of hemorrhoidal problems. The patient states his primary concern is the fact that he feels that he cannot get himself clean. He feels that he has to continually wipe throughout the day because of some leakage. He feels like he doesn't completely empty his rectum when he has a bowel movement. Over the past year or so he's noticed that he's had to strain more when having a bowel movement. He has bowel movements 2-3 times a day. Several months ago he was not drinking a lot of water but recently started drinking about 8 glasses a day. He sits on the commode for 5-7 minutes at a time. He uses dry toilet taper as well as wet wipes.until a few days ago he did not take any supplemental fiber. He denies any weight loss. Several weeks ago he had a very hard stool which cause a lot of pain and resulted in some bleeding for several days. He no longer has any bleeding. He has never had a colonoscopy Past Medical History  Diagnosis Date  . Anxiety     Past Surgical History  Procedure Laterality Date  . Nose surgery      Family History  Problem Relation Age of Onset  . Cancer Maternal Grandmother     Social History History  Substance Use Topics  . Smoking status: Former Smoker    Quit date: 05/18/2007  . Smokeless tobacco: Not on file  . Alcohol Use: Yes     Comment: rarely    Allergies  Allergen Reactions  . Sulfonamide Derivatives     Current Outpatient Prescriptions  Medication Sig Dispense Refill  . clonazePAM (KLONOPIN) 0.5 MG tablet Take 1 tablet (0.5 mg total) by mouth 2 (two) times daily as needed for anxiety.  60 tablet  0  . fluconazole (DIFLUCAN) 200 MG tablet Take 200 mg by mouth daily.      Marland Kitchen LORazepam (ATIVAN) 0.5 MG tablet        . Nitroglycerin 0.4 % OINT Apply 1 inch intra-anally q12 hrs up to 3 wks  30 g  1  . nystatin-triamcinolone (MYCOLOG II) cream       . PROAIR HFA 108 (90 BASE) MCG/ACT inhaler        No current facility-administered medications for this visit.    Review of Systems Review of Systems  Constitutional: Negative for fever, chills, appetite change and unexpected weight change.  HENT: Negative for congestion and trouble swallowing.   Eyes: Negative for visual disturbance.  Respiratory: Negative for chest tightness and shortness of breath.   Cardiovascular: Negative for chest pain and leg swelling.       No PND, no orthopnea, no DOE  Gastrointestinal:       See HPI  Genitourinary: Negative for dysuria and hematuria.  Musculoskeletal: Negative.   Skin: Negative for rash.  Neurological: Negative for seizures and speech difficulty.  Hematological: Does not bruise/bleed easily.  Psychiatric/Behavioral: Negative for behavioral problems and confusion. The patient is nervous/anxious.     Blood pressure 127/88, pulse 77, temperature 98.6 F (37 C), temperature source Temporal, resp. rate 16, height 5\' 9"  (1.753 m), weight 210 lb 12.8 oz (95.618 kg).  Physical Exam Physical Exam  Constitutional: He  is oriented to person, place, and time. He appears well-developed and well-nourished. No distress.  HENT:  Head: Normocephalic and atraumatic.  Right Ear: External ear normal.  Left Ear: External ear normal.  Eyes: Conjunctivae are normal. No scleral icterus.  Neck: Normal range of motion. Neck supple. No tracheal deviation present. No thyromegaly present.  Cardiovascular: Normal rate, normal heart sounds and intact distal pulses.   Pulmonary/Chest: Effort normal and breath sounds normal. No respiratory distress. He has no wheezes.  Abdominal: Soft. He exhibits no distension. There is no tenderness. There is no rebound.  Genitourinary: Rectal exam shows external hemorrhoid. Rectal exam shows anal  tone normal.     Redundant L and R posterolateral nonthrombosed ext hemorrhoidal tissue; resolving tiny posterior midline fissure. Good tone. Anoscopy deferred due to resolving fissure.   Musculoskeletal: Normal range of motion. He exhibits no edema and no tenderness.  Lymphadenopathy:    He has no cervical adenopathy.  Neurological: [name], place, and time. He exhibits normal muscle tone.  Skin: Skin is warm and dry. No rash noted. He is not diaphoretic. No erythema. No pallor.  Psychiatric: He has a normal mood and affect. His behavior is normal. Judgment and thought content normal.    Data Reviewed Dr Brigitte Pulse note  Assessment    Small anal fissure External hermorrhoids     Plan    We discussed the etiology of hemorrhoids. The patient was given educational material as well as diagrams. We discussed nonoperative and operative management of hemorrhoidal disease. We also discussed anal fissure. However his anal fissure is very small and it appears he is not symptomatic from this fissure. I think his primary problem is not having enough fiber in his diet and straining with defecation.  We discussed the importance of having a daily soft bowel movement and avoiding constipation. We also discussed good bowel habits such as not reading in the bathroom, not straining, and drinking 6-8 glasses of water per day. We also discussed the importance of a high fiber diet. We discussed foods that were high in fiber as well as fiber supplements. We discussed the importance of trying to get 25-30 g of fiber per day in their diet. We discussed the need to start with a low dose of fiber and then gradually increasing their daily fiber dose over several weeks in order to avoid bloating and cramping.  We then briefly discussed different surgical techniques for hemorrhoids, specifically excisional hemorrhoidectomy.  PLAN: I don't believe there is any benefit in proceeding with excision  of his external hemorrhoids currently until he improves his bowel habits. Therefore we discussed importance of starting a high fiber diet. We discussed the importance of starting slowly and gradually adding fiber to his diet in order to avoid bloating and cramping. I also encouraged to not be as aggressive in his cleaning. I explained that if he continues to clean as vigorously as he is doing that he may set himself up for pruritus ani.  Hopefully by adding fiber to his diet that'll bulk his stools which will decrease the amount of straining he will have to do and allow him to completely empty his rectum  Followup 6-8 weeks  Leighton Ruff. Redmond Pulling, MD, FACS General, Bariatric, & Minimally Invasive Surgery [facility]          Richard Reyes 05/25/2013, 10:43 AM

## 2013-05-25 NOTE — Patient Instructions (Signed)
GETTING TO GOOD BOWEL HEALTH. Irregular bowel habits such as constipation and diarrhea can lead to many problems over time.  Having one soft bowel movement a day is the most important way to prevent further problems.  The anorectal canal is designed to handle stretching and feces to safely manage our ability to get rid of solid waste (feces, poop, stool) out of our body.  BUT, hard constipated stools can act like ripping concrete bricks and diarrhea can be a burning fire to this very sensitive area of our body, causing inflamed hemorrhoids, anal fissures, increasing risk is perirectal abscesses, abdominal pain/bloating, an making irritable bowel worse.     The goal: ONE SOFT BOWEL MOVEMENT A DAY!  To have soft, regular bowel movements:    Drink at least 8 tall glasses of water a day.     Take plenty of fiber.  Fiber is the undigested part of plant food that passes into the colon, acting s "natures broom" to encourage bowel motility and movement.  Fiber can absorb and hold large amounts of water. This results in a larger, bulkier stool, which is soft and easier to pass. Work gradually over several weeks up to 6 servings a day of fiber (25g a day even more if needed) in the form of: o Vegetables -- Root (potatoes, carrots, turnips), leafy green (lettuce, salad greens, celery, spinach), or cooked high residue (cabbage, broccoli, etc) o Fruit -- Fresh (unpeeled skin & pulp), Dried (prunes, apricots, cherries, etc ),  or stewed ( applesauce)  o Whole grain breads, pasta, etc (whole wheat)  o Bran cereals    Bulking Agents -- This type of water-retaining fiber generally is easily obtained each day by one of the following:  o Psyllium bran -- The psyllium plant is remarkable because its ground seeds can retain so much water. This product is available as Metamucil, Konsyl, Effersyllium, Per Diem Fiber, or the less expensive generic preparation in drug and health food stores. Although labeled a laxative, it really  is not a laxative.  o Methylcellulose -- This is another fiber derived from wood which also retains water. It is available as Citrucel. o Polyethylene Glycol - and "artificial" fiber commonly called Miralax or Glycolax.  It is helpful for people with gassy or bloated feelings with regular fiber o Flax Seed - a less gassy fiber than psyllium   No reading or other relaxing activity while on the toilet. If bowel movements take longer than 5 minutes, you are too constipated   AVOID CONSTIPATION.  High fiber and water intake usually takes care of this.  Sometimes a laxative is needed to stimulate more frequent bowel movements, but    Laxatives are not a good long-term solution as it can wear the colon out. o Osmotics (Milk of Magnesia, Fleets phosphosoda, Magnesium citrate, MiraLax, GoLytely) are safer than  o Stimulants (Senokot, Castor Oil, Dulcolax, Ex Lax)    o Do not take laxatives for more than 7days in a row.    IF SEVERELY CONSTIPATED, try a Bowel Retraining Program: o Do not use laxatives.  o Eat a diet high in roughage, such as bran cereals and leafy vegetables.  o Drink six (6) ounces of prune or apricot juice each morning.  o Eat two (2) large servings of stewed fruit each day.  o Take one (1) heaping tablespoon of a psyllium-based bulking agent twice a day. Use sugar-free sweetener when possible to avoid excessive calories.  o Eat a normal breakfast.  o   Set aside 15 minutes after breakfast to sit on the toilet, but do not strain to have a bowel movement.  o If you do not have a bowel movement by the third day, use an enema and repeat the above steps.  Anal Pruritus Anal pruritus is an itching of the anus, which is often due to increased moisture of the skin around the anus. Moisture may be due to sweating or a small amount of remaining stool. The itching and scratching can cause further skin damage.  CAUSES   Poor hygiene.  Excessive moisture from sweating or residual stool in the  anal area.  Perfumed soaps and sprays and colored toilet paper.  Chemicals in the foods you eat.  Dietary factors such as caffeine, beer, milk products, chocolate, nuts, citrus fruits, tomatoes, spicy seasonings, jalapeno peppers, and salsa.  Hemorrhoids, infections, and other anal diseases.  Excessive washing.  Overuse of laxatives.  Skin disorders (psoriasis, eczema, or seborrhea). HOME CARE INSTRUCTIONS   Practice good hygiene.  Clean the anal area gently with wet toilet paper, baby wipes, or a wet washcloth after every bowel movement and at bedtime. Avoid using soaps on the anal area. Dry the area thoroughly. Pat the area dry with toilet paper or a towel.  Do not scrub the anal area with anything, even toilet paper.  Try not to scratch the itchy area. Scratching produces more damage, which makes the itching worse.  Take sitz baths in warm water for 15 to 20 minutes, 2 to 3 times a day. Pat the area dry with a soft cloth after each bath.  Zinc oxide ointment or a moisture barrier cream can be applied several times daily to protect the skin.  Only take medicines as directed by your caregiver.  Talk to your caregiver about fiber supplements. These are helpful in normalizing the stool if you have frequent loose stools.  Wear cotton underwear and loose clothing.  Do not use irritants such as bubble baths, scented toilet paper, or genital deodorants. SEEK MEDICAL CARE IF:   Itching does not improve in several days or gets worse.  You have a fever.  There are problems with increased pain, swelling, or redness. MAKE SURE YOU:   Understand these instructions.  Will watch your condition.  Will get help right away if you are not doing well or get worse. Document Released: 06/23/2010 Document Revised: 03/16/2011 Document Reviewed: 06/23/2010 [facility] Patient Information 2014 [place].

## 2013-06-02 ENCOUNTER — Encounter (INDEPENDENT_AMBULATORY_CARE_PROVIDER_SITE_OTHER): Payer: Self-pay | Admitting: Neurology

## 2013-06-02 ENCOUNTER — Encounter: Payer: Self-pay | Admitting: Neurology

## 2013-06-02 ENCOUNTER — Ambulatory Visit (INDEPENDENT_AMBULATORY_CARE_PROVIDER_SITE_OTHER): Payer: BC Managed Care – PPO | Admitting: Neurology

## 2013-06-02 ENCOUNTER — Ambulatory Visit (INDEPENDENT_AMBULATORY_CARE_PROVIDER_SITE_OTHER): Payer: BC Managed Care – PPO | Admitting: Family Medicine

## 2013-06-02 ENCOUNTER — Ambulatory Visit: Payer: BC Managed Care – PPO

## 2013-06-02 VITALS — BP 107/70 | HR 100 | Resp 18 | Ht 69.75 in | Wt 210.0 lb

## 2013-06-02 VITALS — BP 130/72 | HR 97 | Temp 97.7°F | Resp 16 | Ht 69.75 in | Wt 212.0 lb

## 2013-06-02 DIAGNOSIS — G4730 Sleep apnea, unspecified: Secondary | ICD-10-CM

## 2013-06-02 DIAGNOSIS — G4710 Hypersomnia, unspecified: Secondary | ICD-10-CM

## 2013-06-02 DIAGNOSIS — R0683 Snoring: Secondary | ICD-10-CM

## 2013-06-02 DIAGNOSIS — G4700 Insomnia, unspecified: Secondary | ICD-10-CM

## 2013-06-02 DIAGNOSIS — M25579 Pain in unspecified ankle and joints of unspecified foot: Secondary | ICD-10-CM

## 2013-06-02 DIAGNOSIS — K6289 Other specified diseases of anus and rectum: Secondary | ICD-10-CM

## 2013-06-02 DIAGNOSIS — L919 Hypertrophic disorder of the skin, unspecified: Secondary | ICD-10-CM

## 2013-06-02 DIAGNOSIS — L918 Other hypertrophic disorders of the skin: Secondary | ICD-10-CM

## 2013-06-02 DIAGNOSIS — G4733 Obstructive sleep apnea (adult) (pediatric): Secondary | ICD-10-CM

## 2013-06-02 DIAGNOSIS — L909 Atrophic disorder of skin, unspecified: Secondary | ICD-10-CM

## 2013-06-02 DIAGNOSIS — R0989 Other specified symptoms and signs involving the circulatory and respiratory systems: Secondary | ICD-10-CM

## 2013-06-02 DIAGNOSIS — S93609A Unspecified sprain of unspecified foot, initial encounter: Secondary | ICD-10-CM

## 2013-06-02 DIAGNOSIS — Z0289 Encounter for other administrative examinations: Secondary | ICD-10-CM

## 2013-06-02 DIAGNOSIS — S96911A Strain of unspecified muscle and tendon at ankle and foot level, right foot, initial encounter: Secondary | ICD-10-CM

## 2013-06-02 DIAGNOSIS — R0609 Other forms of dyspnea: Secondary | ICD-10-CM

## 2013-06-02 MED ORDER — SUVOREXANT 15 MG PO TABS: 15.0000 mg | ORAL_TABLET | Freq: Every evening | ORAL | Status: DC

## 2013-06-02 MED ORDER — MOMETASONE FUROATE 50 MCG/ACT NA SUSP: 2.0000 | Freq: Every day | NASAL | Status: DC

## 2013-06-02 NOTE — Progress Notes (Signed)
Subjective: Patient is here with several concerns. He has been having pain in the right foot. He had tripped down a stair and twisted his foot under. It hurts him on the lateral aspect of the right midfoot. Also hurts up into the ankle. He also has been having problems with rectal pain. He is seeing a Psychologist, sport and exercise for an anal fissure. He's been using cream on it and it continues to hurt some but is gradually improved. It is stop bleeding. I  He was here recently and had a lot of skin tags removed. He has several that they noticed that he would like removed.  Objective: Tender in the lateral aspect of the right foot just proximal and medial to the head of the fifth metatarsal. He also has some tenderness in the lateral aspect of the ankle and up the lateral part of the distal leg. He has small hemorrhoids. No major erythema. No active fissure could be seen at this time. He has 3 fibrocutaneous skin tags on the right side of his neck posteriorly.  Procedure note The skin tags were prepped with alcohol and trimmed off using sharp dissection with a scalpel. He tolerated this well. They were allowed stop bleeding secondarily.  [facility] reading (PRIMARY) by  Dr. Linna Darner Normal x-ray.  Assessment: Midfoot sprain and pain Ankle pain Fibrocutaneous skin tags Resolving anal fissure  Plan: Continue current care of his anal fissure. Avoid excessive straining. Rest of the foot the best he can. Skin tags removed as noted.

## 2013-06-02 NOTE — Patient Instructions (Signed)
The skin tags should stop bleeding after a few more minutes  Continue local care of rectal fissure. Avoid heavy lifting and straining that might strain the anal area.  Take Aleve 2 pills twice daily if needed for foot pain. Continue trying to rest the foot in a fashion to avoid excessive straining of it over the next couple of weeks until the pain resolves. If symptoms persist return. I will let you know if radiologist sees any additional problems in in the bones.

## 2013-06-02 NOTE — Progress Notes (Signed)
[facility]  Provider:  Larey Seat, M D  Referring Provider: Shawnee Knapp, MD Primary Care Physician:  Delman Cheadle, MD  OSA untreated, insomnia and hypersomnia.   HPI:  Richard Reyes is a 48 y.o.  right handed, single , caucasian male , who is seen here as a referral from Dr. Brigitte Pulse for consultation. Mr. Laurena Bering reports that about 8 years ago he had a urinary tract infection causing him to have nocturia or urinary frequency interrupting her sleep. Until then he had never had trouble to initiate or maintain sleep. On the contrary enough to sleep 10 hours and he felt his sleep was refreshing and restoring. He was given Lunesta to  sleep through the night while having nocturia, was waiting for a Urology appointment. He became insomnic. He was referred to [facility] for a Sleep study , was diagnosed with OSA and prescribed CPAP.  He had d gained 30 pounds while newly insomnic. He never tolerated the CPAP and FFM, was prescribed Ambien - made phone calls and rearranged furniture , became day time forgetfully, amnestic for conversations. He ow is unemployed, feels the memory is a severe handicap to his re- employment.  He now looks for a sleep apnea treatment, a new way to be healthier, fitter and more alert.    His bed time 10.30 , he gets into dimmed light 30 minute before. His sleep latency often is 60 minutes or longer -  No TV no reading in bed. He goes to the bathroom 2-3 times, he rarely dreams.  He drinks no alcohol, no longer smoking, no caffeine after lunch. . When he wakes up at 7.30 ( when he left for work, he is now unemployed but keeps his routine) - he is neither refreshed or restored- he feels tired , he doesn't nap. He no longer drinks caffeine in AM. He was exposed to natural daylight.  He gained weight over the last year after a foot injury. 20 pounds.     He has a nasal septal deviation restricting airflow, has rhinitis, he has no retrognathia , but a crowded jaw.     Review of Systems: Out of a complete 14 system review, the patient complains of only the following symptoms, and all other reviewed systems are negative. FSS 30, Epworth 10 , snoring, weight gain.   History   Social History  . Marital Status: Single    Spouse Name: N/A    Number of Children: 0  . Years of Education: 16   Occupational History  .  [employer]   Social History Main Topics  . Smoking status: Former Smoker    Quit date: 05/18/2007  . Smokeless tobacco: Never Used  . Alcohol Use: Yes     Comment: rarely  . Drug Use: No  . Sexual Activity: Not on file   Other Topics Concern  . Not on file   Social History Narrative   Patient is single and lives alone.   Patient has a college education.   Patient is unemployed.   Patient is right-handed.   Patient does not drink any caffeine.    Family History  Problem Relation Age of Onset  . Cancer Maternal Grandmother     Past Medical History  Diagnosis Date  . Anxiety     Past Surgical History  Procedure Laterality Date  . Nose surgery      Current Outpatient Prescriptions  Medication Sig Dispense Refill  . clonazePAM (KLONOPIN) 0.5 MG tablet  Take 1 tablet (0.5 mg total) by mouth 2 (two) times daily as needed for anxiety.  60 tablet  0  . Nitroglycerin 0.4 % OINT Apply 1 inch intra-anally q12 hrs up to 3 wks  30 g  1  . nystatin-triamcinolone (MYCOLOG II) cream       . PROAIR HFA 108 (90 BASE) MCG/ACT inhaler        No current facility-administered medications for this visit.    Allergies as of 06/02/2013 - Review Complete 06/02/2013  Allergen Reaction Noted  . Sulfonamide derivatives  09/15/2007    Vitals: BP 107/70  Pulse 100  Resp 18  Ht 5' 9.75" (1.772 m)  Wt 210 lb (95.255 kg)  BMI 30.34 kg/m2 Last Weight:  Wt Readings from Last 1 Encounters:  06/02/13 210 lb (95.255 kg)   Last Height:   Ht Readings from Last 1 Encounters:  06/02/13 5' 9.75" (1.772 m)    Physical  exam:  General: The patient is awake, alert and appears not in acute distress. The patient is well groomed. Head: Normocephalic, atraumatic. Neck is supple. Mallampati 2, with a very prolonged  Uvula- , neck circumference: 18 inches.  Cardiovascular:  Regular rate and rhythm, without  murmurs or carotid bruit, and without distended neck veins. Respiratory: Lungs are clear to auscultation. Skin:  Without evidence of edema, or rash, facial erythema. Puffiness.  Trunk: BMI is  elevated and patient  has normal posture.  Neurologic exam : The patient is awake and alert, oriented to place and time.  Memory subjective  described as intact.  There is a normal attention span & concentration ability.  Speech is fluent without  dysarthria, dysphonia or aphasia. Mood and affect are appropriate.  Cranial nerves: Pupils are equal and briskly reactive to light. Funduscopic exam without  evidence of pallor or edema. Extraocular movements  in vertical and horizontal planes intact and without nystagmus. Visual fields by finger perimetry are intact. Hearing to finger rub intact.  Facial sensation intact to fine touch. Facial motor strength is symmetric and tongue and uvula move midline.  Motor exam:  Normal muscle bulk and symmetric normal strength in all extremities.  Sensory:  Fine touch, pinprick and vibration were  normal.  Coordination: Rapid alternating movements in the fingers/hands is tested and normal.  Finger-to-nose maneuver tested and normal without evidence of ataxia, dysmetria or tremor.  Gait and station: Patient walks without assistive device .  Strength within normal limits. Stance is stable and normal. Tandem gait is unfragmented. Romberg testing is normal.  Deep tendon reflexes: in the  upper and lower extremities are symmetric and intact. Babinski maneuver response is  downgoing.   Assessment:  After physical and neurologic examination, review of laboratory studies, imaging,  neurophysiology testing and pre-existing records, assessment is  1) suspect that the patient still has OSA, perhaps more than 7-8 years ago. 2) insomnia , chronic sleep maintenance and initiation problems.  rhinitis- nasocort    Plan:  Treatment plan and additional workup : COBRA insured - need study in June.  SPLIT study: nasocort prescribed.  Try to avoid FFM.

## 2013-06-02 NOTE — Patient Instructions (Signed)
Insomnia Insomnia Insomnia is frequent trouble falling and/or staying asleep. Insomnia can be a long term problem or a short term problem. Both are common. Insomnia can be a short term problem when the wakefulness is related to a certain stress or worry. Long term insomnia is often related to ongoing stress during waking hours and/or poor sleeping habits. Overtime, sleep deprivation itself can make the problem worse. Every little thing feels more severe because you are overtired and your ability to cope is decreased. CAUSES   Stress, anxiety, and depression.  Poor sleeping habits.  Distractions such as TV in the bedroom.  Naps close to bedtime.  Engaging in emotionally charged conversations before bed.  Technical reading before sleep.  Alcohol and other sedatives. They may make the problem worse. They can hurt normal sleep patterns and normal dream activity.  Stimulants such as caffeine for several hours prior to bedtime.  Pain syndromes and shortness of breath can cause insomnia.  Exercise late at night.  Changing time zones may cause sleeping problems (jet lag). It is sometimes helpful to have someone observe your sleeping patterns. They should look for periods of not breathing during the night (sleep apnea). They should also look to see how long those periods last. If you live alone or observers are uncertain, you can also be observed at a sleep clinic where your sleep patterns will be professionally monitored. Sleep apnea requires a checkup and treatment. Give your caregivers your medical history. Give your caregivers observations your family has made about your sleep.  SYMPTOMS   Not feeling rested in the morning.  Anxiety and restlessness at bedtime.  Difficulty falling and staying asleep. TREATMENT   Your caregiver may prescribe treatment for an underlying medical disorders. Your caregiver can give advice or help if you are using alcohol or other drugs for self-medication.  Treatment of underlying problems will usually eliminate insomnia problems.  Medications can be prescribed for short time use. They are generally not recommended for lengthy use.  Over-the-counter sleep medicines are not recommended for lengthy use. They can be habit forming.  You can promote easier sleeping by making lifestyle changes such as:  Using relaxation techniques that help with breathing and reduce muscle tension.  Exercising earlier in the day.  Changing your diet and the time of your last meal. No night time snacks.  Establish a regular time to go to bed.  Counseling can help with stressful problems and worry.  Soothing music and white noise may be helpful if there are background noises you cannot remove.  Stop tedious detailed work at least one hour before bedtime. HOME CARE INSTRUCTIONS   Keep a diary. Inform your caregiver about your progress. This includes any medication side effects. See your caregiver regularly. Take note of:  Times when you are asleep.  Times when you are awake during the night.  The quality of your sleep.  How you feel the next day. This information will help your caregiver care for you.  Get out of bed if you are still awake after 15 minutes. Read or do some quiet activity. Keep the lights down. Wait until you feel sleepy and go back to bed.  Keep regular sleeping and waking hours. Avoid naps.  Exercise regularly.  Avoid distractions at bedtime. Distractions include watching television or engaging in any intense or detailed activity like attempting to balance the household checkbook.  Develop a bedtime ritual. Keep a familiar routine of bathing, brushing your teeth, climbing into bed at the  same time each night, listening to soothing music. Routines increase the success of falling to sleep faster.  Use relaxation techniques. This can be using breathing and muscle tension release routines. It can also include visualizing peaceful scenes.  You can also help control troubling or intruding thoughts by keeping your mind occupied with boring or repetitive thoughts like the old concept of counting sheep. You can make it more creative like imagining planting one beautiful flower after another in your backyard garden.  During your day, work to eliminate stress. When this is not possible use some of the previous suggestions to help reduce the anxiety that accompanies stressful situations. MAKE SURE YOU:   Understand these instructions.  Will watch your condition.  Will get help right away if you are not doing well or get worse. Document Released: 12/20/1999 Document Revised: 03/16/2011 Document Reviewed: 01/19/2007 [facility] Patient Information 2014 [place].

## 2013-06-13 ENCOUNTER — Encounter: Payer: Self-pay | Admitting: *Deleted

## 2013-06-13 ENCOUNTER — Telehealth: Payer: Self-pay | Admitting: Neurology

## 2013-06-13 DIAGNOSIS — G4733 Obstructive sleep apnea (adult) (pediatric): Secondary | ICD-10-CM

## 2013-06-13 NOTE — Telephone Encounter (Signed)
I called and spoke with the patient about his recent sleep study results. I informed the patient that the study revealed severe obstructive sleep apnea and that he did well on CPAP during the night of his study with significant improvement of his respiratory events. Dr. Brett Fairy recommend starting CPAP therapy at home. I will send the order to [facility] who will contact the patient. I will fax Dr. Wilhemina Bonito office and mail a copy to the patient along with a follow up instruction letter.

## 2013-07-13 ENCOUNTER — Ambulatory Visit (INDEPENDENT_AMBULATORY_CARE_PROVIDER_SITE_OTHER): Payer: BC Managed Care – PPO | Admitting: Family Medicine

## 2013-07-13 VITALS — BP 118/72 | HR 79 | Temp 98.1°F | Resp 16 | Ht 69.25 in | Wt 207.2 lb

## 2013-07-13 DIAGNOSIS — R42 Dizziness and giddiness: Secondary | ICD-10-CM

## 2013-07-13 DIAGNOSIS — F5102 Adjustment insomnia: Secondary | ICD-10-CM

## 2013-07-13 DIAGNOSIS — L719 Rosacea, unspecified: Secondary | ICD-10-CM

## 2013-07-13 DIAGNOSIS — J45990 Exercise induced bronchospasm: Secondary | ICD-10-CM

## 2013-07-13 DIAGNOSIS — G4733 Obstructive sleep apnea (adult) (pediatric): Secondary | ICD-10-CM

## 2013-07-13 DIAGNOSIS — R21 Rash and other nonspecific skin eruption: Secondary | ICD-10-CM

## 2013-07-13 DIAGNOSIS — Z23 Encounter for immunization: Secondary | ICD-10-CM

## 2013-07-13 DIAGNOSIS — Z0000 Encounter for general adult medical examination without abnormal findings: Secondary | ICD-10-CM

## 2013-07-13 DIAGNOSIS — R7989 Other specified abnormal findings of blood chemistry: Secondary | ICD-10-CM

## 2013-07-13 DIAGNOSIS — R351 Nocturia: Secondary | ICD-10-CM

## 2013-07-13 DIAGNOSIS — E291 Testicular hypofunction: Secondary | ICD-10-CM

## 2013-07-13 LAB — POCT UA - MICROSCOPIC ONLY
Casts, Ur, LPF, POC: NEGATIVE
Crystals, Ur, HPF, POC: NEGATIVE
Mucus, UA: NEGATIVE
Yeast, UA: NEGATIVE

## 2013-07-13 LAB — CBC
HCT: 41.9 % (ref 39.0–52.0)
Hemoglobin: 14.8 g/dL (ref 13.0–17.0)
MCH: 31.8 pg (ref 26.0–34.0)
MCHC: 35.3 g/dL (ref 30.0–36.0)
MCV: 89.9 fL (ref 78.0–100.0)
Platelets: 291 10*3/uL (ref 150–400)
RBC: 4.66 MIL/uL (ref 4.22–5.81)
RDW: 13.5 % (ref 11.5–15.5)
WBC: 6.5 10*3/uL (ref 4.0–10.5)

## 2013-07-13 LAB — HIV ANTIBODY (ROUTINE TESTING W REFLEX): HIV 1&2 Ab, 4th Generation: NONREACTIVE

## 2013-07-13 LAB — POCT URINALYSIS DIPSTICK
Bilirubin, UA: NEGATIVE
Glucose, UA: NEGATIVE
Ketones, UA: NEGATIVE
Leukocytes, UA: NEGATIVE
Nitrite, UA: NEGATIVE
Protein, UA: NEGATIVE
Spec Grav, UA: 1.025
Urobilinogen, UA: 0.2
pH, UA: 5.5

## 2013-07-13 LAB — RPR: RPR Ser Ql: REACTIVE — AB

## 2013-07-13 LAB — TSH: TSH: 1.17 u[IU]/mL (ref 0.350–4.500)

## 2013-07-13 LAB — RPR TITER: RPR Titer: 1:1 {titer}

## 2013-07-13 MED ORDER — METRONIDAZOLE 1 % EX GEL: Freq: Every day | CUTANEOUS | Status: DC

## 2013-07-13 MED ORDER — CLONAZEPAM 0.5 MG PO TABS
0.2500 mg | ORAL_TABLET | Freq: Every evening | ORAL | Status: DC | PRN
Start: 2013-07-13 — End: 2013-07-17

## 2013-07-13 NOTE — Progress Notes (Signed)
Subjective:    Patient ID: Richard Reyes, male    DOB: 1965/03/24, 48 y.o.   MRN: [id number]  HPI Richard Reyes is a 48 y.o. male PCP: Delman Cheadle, MD Here for physical today and multiple other concerns he would like addressed. Fasting today.  No scheduled appt with Dr. Brigitte Pulse at this point.   Colonoscopy: has not had performed. appt with GI later this month.  Prostate cancer screening/last PSA: has had prostate biopsy by [facility] few years ago - normal then. No recent testing. Would like to have prostate cancer screening.   Tetanus/Tdap: may be more than 10 years ago.  Dentist: last eval about 6 months ago. Optho/eye care eval: Dr. Bing Plume - this year.   Exercise: started back - 3-4 times per week.   Situational anxiety - in transition to another job, resigned from prior job few months ago.  interviewing for consulting with dealerships. Has taken Klonopin at night just to sleep. Ran out yesterday. Taking every night to sleep  - plans on weaning off.  Not jittery anymore.  No depression, no anhedonia.   Skin issue - rash on R arm - past few days - hx of Rosacea, Eczema, and Acne in times.  Rash on R forearm past few days - slight itch.  Has been working in yard, but covered. Followed by dermatology in past - Dr. Jarome Matin about a year ago.  Used retin A for acne, and pill for rosacea - as it has affected scalp. Has used metronidazole gel in past as well for face. Some flair past few weeks.   OSA - diagnosed few weeks ago. Using CPAP.  Doing ok. Tolerating mask.  Sleeping better.   Getting up few times to urinate - 2 times per night - past few months. More often prior to starting CPAP.  No burning. No hx of STI's. Not recently sexually active. HIV test about 3 months ago.  Would like gonorrhea and chlamydia testing today as well.   Hx of low testosterone. No recent treatment.   Dizziness - few times if standing up quickly only.  Exercises without difficulty, no chest pains. Occasional  wheeze with exercise in past - has inhaler for this.  Lab Results  Component Value Date   TESTOSTERONE 266.67* 04/16/2008     Patient Active Problem List   Diagnosis Date Noted  . Hypersomnia 06/02/2013  . BACK PAIN, LUMBAR 04/16/2008  . OBSTRUCTIVE SLEEP APNEA 11/15/2007  . OTHER NONSPECIFIC ABNORMAL SERUM ENZYME LEVELS 10/19/2007  . DEPRESSION 10/09/2007  . TESTOSTERONE DEFICIENCY 10/06/2007  . PURE HYPERCHOLESTEROLEMIA 10/06/2007  . TOBACCO ABUSE 10/06/2007  . INSOMNIA 10/06/2007   Past Medical History  Diagnosis Date  . Anxiety    Past Surgical History  Procedure Laterality Date  . Nose surgery     Allergies  Allergen Reactions  . Sulfonamide Derivatives    Prior to Admission medications   Medication Sig Start Date End Date Taking? Authorizing Provider  clonazePAM (KLONOPIN) 0.5 MG tablet Take 1 tablet (0.5 mg total) by mouth 2 (two) times daily as needed for anxiety. 05/17/13  Yes Thao P Le, DO  mometasone (NASONEX) 50 MCG/ACT nasal spray Place 2 sprays into the nose daily. 06/02/13  Yes Carmen Dohmeier, MD  Nitroglycerin 0.4 % OINT Apply 1 inch intra-anally q12 hrs up to 3 wks 05/20/13  Yes Shawnee Knapp, MD  nystatin-triamcinolone [facility]) cream  05/16/13  Yes Historical Provider, MD  PROAIR HFA 108 (90 BASE) MCG/ACT inhaler  05/11/13  Yes Historical Provider, MD   History   Social History  . Marital Status: Single    Spouse Name: N/A    Number of Children: 0  . Years of Education: 16   Occupational History  .  [place]   Social History Main Topics  . Smoking status: Former Smoker    Quit date: 05/18/2007  . Smokeless tobacco: Never Used  . Alcohol Use: Yes     Comment: rarely  . Drug Use: No  . Sexual Activity: Not on file   Other Topics Concern  . Not on file   Social History Narrative   Patient is single and lives alone.   Patient has a college education.   Patient is unemployed.   Patient is right-handed.   Patient does not drink any  caffeine.   Review of Systems 13 point review of systems per patient health survey noted.  Negative other than as indicated above.      Objective:   Physical Exam  Vitals reviewed. Constitutional: He is oriented to person, place, and time. He appears well-developed and well-nourished.  HENT:  Head: Normocephalic and atraumatic.  Right Ear: External ear normal.  Left Ear: External ear normal.  Mouth/Throat: Oropharynx is clear and moist.  Eyes: Conjunctivae and EOM are normal. Pupils are equal, round, and reactive to light.  Neck: Normal range of motion. Neck supple. No thyromegaly present.  Cardiovascular: Normal rate, regular rhythm, normal heart sounds and intact distal pulses.   Pulmonary/Chest: Effort normal and breath sounds normal. No respiratory distress. He has no wheezes.  Abdominal: Soft. He exhibits no distension. There is no tenderness. Hernia confirmed negative in the right inguinal area and confirmed negative in the left inguinal area.  Genitourinary: Prostate is not tender (nontender, firm, but no apparent nodules. ).  Musculoskeletal: Normal range of motion. He exhibits no edema and no tenderness.  Lymphadenopathy:    He has no cervical adenopathy.  Neurological: [name], place, and time. He has normal reflexes.  Skin: Skin is warm and dry.  Psychiatric: He has a normal mood and affect. His behavior is normal.   Filed Vitals:   07/13/13 0822  BP: 118/72  Pulse: 79  Temp: 98.1 F (36.7 C)  TempSrc: Oral  Resp: 16  Height: 5' 9.25" (1.759 m)  Weight: 207 lb 3.2 oz (93.985 kg)  SpO2: 98%   EKG: SR, rate 59, no acute findings.   Results for orders placed in visit on 07/13/13  POCT UA - MICROSCOPIC ONLY      Result Value Ref Range   WBC, Ur, HPF, POC 0-3     RBC, urine, microscopic 0-2     Bacteria, U Microscopic trace     Mucus, UA neg     Epithelial cells, urine per micros 0-5     Crystals, Ur, HPF, POC neg     Casts, Ur, LPF,  POC neg     Yeast, UA neg    POCT URINALYSIS DIPSTICK      Result Value Ref Range   Color, UA yellow     Clarity, UA clear     Glucose, UA neg     Bilirubin, UA neg     Ketones, UA neg     Spec Grav, UA 1.025     Blood, UA small     pH, UA 5.5     Protein, UA neg     Urobilinogen, UA 0.2  Nitrite, UA neg     Leukocytes, UA Negative          Assessment & Plan:   Richard Reyes is a 48 y.o. male Annual physical exam - Plan: RPR, HIV antibody, TSH, Testosterone, free, total, EKG 12-Lead, GC/Chlamydia Probe Amp  --anticipatory guidance as below in AVS, screening labs above. Health maintenance items as above in HPI discussed/recommended as applicable. tdap updated. Chose to have prostate cancer screening - although would not want to have further biopsies if elevated. Discussed would recommend urology discussion if elevated.  Insomnia due to stress - Plan: clonazePAM (KLONOPIN) 0.5 MG tablet  -improved.  Can wean to 1/2 of klonopin QHS prn and to follow up with PCP to discuss further.   Exercise-induced asthma  -cont albuterol if needed, but if increased sx's with exercise or not responding to albuterol - rtc for eval/discuss further.   Nocturia - Plan: POCT UA - Microscopic Only, POCT urinalysis dipstick, CBC  -PSA pending. U/a in office reassuring. rtc precautions.   Rosacea - Plan: metroNIDAZOLE (METROGEL) 1 % gel  -Restart metronidazole topical, avoid triggers and to follow up with dermatologist.   Rash and nonspecific skin eruption - Plan: CBC  - R arm - possible mild poison ivy with working outside - topical otc cortisone, rtc if spreads.   Low testosterone - Plan: PSA, Testosterone, free, total  - recheck level, but discussed if low, would need verification with multiple am levels.   Dizziness - Plan: CBC  - orthostatic? With standing up quickly. Increase fluid intake during day, and if persistent with this - RTC to discuss further. Lytes, and tsh pending.   Need  for prophylactic vaccination with combined diphtheria-tetanus-pertussis (DTP) vaccine - Plan: Tdap vaccine greater than or equal to 7yo IM given.   OSA (obstructive sleep apnea) - improved with CPAP.  Groin rash - After visit completed and as handing after visit summary - he also asked to have a cream refilled to apply to rash on inside of thighs. Did not specifically address this in room, but by hx prior to leaving - sounds like this is tinea as notes after being out in heat and located in groin folds. Can try otc lotrimin BID for few weeks, then recheck if not resolving.   Will schedule for follow up with PCP by appt and discussed to pick most pressing few concerns to address at that visit to allow sufficient time.    Meds ordered this encounter  Medications  . clonazePAM (KLONOPIN) 0.5 MG tablet    Sig: Take 0.5 tablets (0.25 mg total) by mouth at bedtime as needed for anxiety.    Dispense:  60 tablet    Refill:  0  . metroNIDAZOLE (METROGEL) 1 % gel    Sig: Apply topically daily.    Dispense:  60 g    Refill:  1   Patient Instructions  You should receive a call or letter about your lab results within the next week to 10 days.  We will call you for follow up appointment with Dr. Brigitte Pulse.  Can take 1/2 tablet of klonopin as needed at bedtime.  Increase fluid intake during the day to lessen chance of dizziness with siting up - if this persists - return for recheck. Return to the clinic or go to the nearest emergency room if any of your symptoms worsen or new symptoms occur. metrogel ointment for rosacea. Call your dermatologist for other medication and to schedule follow up appointment.  Ok  to apply over the counter hydrocortisone to rash on arm. If rash spreads - return to clinic.  tdap updated today.  Over the counter clotrimazole or lotrimin cream twice per day to groin rash - if not improved in 3-4 weeks, return for recheck.   Rosacea Rosacea is a long-term (chronic) condition that  affects the skin of the face (cheeks, nose, brow, and chin) and sometimes the eyes. Rosacea causes the blood vessels near the surface of the skin to enlarge, resulting in redness. This condition usually begins after age 81. It occurs most often in light-skinned women. Without treatment, rosacea tends to get worse over time. There is no cure for rosacea, but treatment can help control your symptoms. CAUSES  The cause is unknown. It is thought that some people may inherit a tendency to develop rosacea. Certain triggers can make your rosacea worse, including:  Hot baths.  Exercise.  Sunlight.  Very hot or cold temperatures.  Hot or spicy foods and drinks.  Drinking alcohol.  Stress.  Taking blood pressure medicine.  Long-term use of topical steroids on the face. SYMPTOMS   Redness of the face.  Red bumps or pimples on the face.  Red, enlarged nose (rhinophyma).  Blushing easily.  Red lines on the skin.  Irritated or burning feeling in the eyes.  Swollen eyelids. DIAGNOSIS  Your caregiver can usually tell what is wrong by asking about your symptoms and performing a physical exam. TREATMENT  Avoiding triggers is an important part of treatment. You will also need to see a skin specialist (dermatologist) who can develop a treatment plan for you. The goals of treatment are to control your condition and to improve the appearance of your skin. It may take several weeks or months of treatment before you notice an improvement in your skin. Even after your skin improves, you will likely need to continue treatment to prevent your rosacea from coming back. Treatment methods may include:  Using sunscreen or sunblock daily to protect the skin.  Antibiotic medicine, such as metronidazole, applied directly to the skin.  Antibiotics taken by mouth. This is usually prescribed if you have eye problems from your rosacea.  Laser surgery to improve the appearance of the skin. This surgery can  reduce the appearance of red lines on the skin and can remove excess tissue from the nose to reduce its size. HOME CARE INSTRUCTIONS  Avoid things that seem to trigger your flare-ups.  If you are given antibiotics, take them as directed. Finish them even if you start to feel better.  Use a gentle facial cleanser that does not contain alcohol.  You may use a mild facial moisturizer.  Use a sunscreen or sunblock with SPF 30 or greater.  Wear a green-tinted foundation powder to conceal redness, if needed. Choose cosmetics that are noncomedogenic. This means they do not block your pores.  If your eyelids are affected, apply warm compresses to the eyelids. Do this up to 4 times a day or as directed by your caregiver. SEEK MEDICAL CARE IF:  Your skin problems get worse.  You feel depressed.  You lose your appetite.  You have trouble concentrating.  You have problems with your eyes, such as redness or itching. MAKE SURE YOU:  Understand these instructions.  Will watch your condition.  Will get help right away if you are not doing well or get worse. Document Released: 01/30/2004 Document Revised: 06/23/2011 Document Reviewed: 12/02/2010 [facility] Patient Information 2015 [place], [place]. This information is not  intended to replace advice given to you by your health care provider. Make sure you discuss any questions you have with your health care provider.  Keeping you healthy  Get these tests  Blood pressure- Have your blood pressure checked once a year by your healthcare provider.  Normal blood pressure is 120/80  Weight- Have your body mass index (BMI) calculated to screen for obesity.  BMI is a measure of body fat based on height and weight. You can also calculate your own BMI at ViewBanking.si.  Cholesterol- Have your cholesterol checked every year.  Diabetes- Have your blood sugar checked regularly if you have high blood pressure, high cholesterol, have a family  history of diabetes or if you are overweight.  Screening for Colon Cancer- Colonoscopy starting at age 109.  Screening may begin sooner depending on your family history and other health conditions. Follow up colonoscopy as directed by your Gastroenterologist.  Screening for Prostate Cancer- Both blood work (PSA) and a rectal exam help screen for Prostate Cancer.  Screening begins at age 35 with African-American men and at age 37 with Caucasian men.  Screening may begin sooner depending on your family history.  Take these medicines  Aspirin- One aspirin daily can help prevent Heart disease and Stroke.  Flu shot- Every fall.  Tetanus- Every 10 years.  Zostavax- Once after the age of 34 to prevent Shingles.  Pneumonia shot- Once after the age of 24; if you are younger than 59, ask your healthcare provider if you need a Pneumonia shot.  Take these steps  Don't smoke- If you do smoke, talk to your doctor about quitting.  For tips on how to quit, go to www.smokefree.gov or call 1-800-QUIT-NOW.  Be physically active- Exercise 5 days a week for at least 30 minutes.  If you are not already physically active start slow and gradually work up to 30 minutes of moderate physical activity.  Examples of moderate activity include walking briskly, mowing the yard, dancing, swimming, bicycling, etc.  Eat a healthy diet- Eat a variety of healthy food such as fruits, vegetables, low fat milk, low fat cheese, yogurt, lean meant, poultry, fish, beans, tofu, etc. For more information go to www.thenutritionsource.org  Drink alcohol in moderation- Limit alcohol intake to less than two drinks a day. Never drink and drive.  Dentist- Brush and floss twice daily; visit your dentist twice a year.  Depression- Your emotional health is as important as your physical health. If you're feeling down, or losing interest in things you would normally enjoy please talk to your healthcare provider.  Eye exam- Visit your eye  doctor every year.  Safe sex- If you may be exposed to a sexually transmitted infection, use a condom.  Seat belts- Seat belts can save your life; always wear one.  Smoke/Carbon Monoxide detectors- These detectors need to be installed on the appropriate level of your home.  Replace batteries at least once a year.  Skin cancer- When out in the sun, cover up and use sunscreen 15 SPF or higher.  Violence- If anyone is threatening you, please tell your healthcare provider. Living Will/ Health care power of attorney- Speak with your healthcare provider and family.Tetanus, Diphtheria, Pertussis (Tdap) Vaccine What You Need to Know WHY GET VACCINATED? Tetanus, diphtheria and pertussis can be very serious diseases, even for adolescents and adults. Tdap vaccine can protect [place] from these diseases. TETANUS (Lockjaw) causes painful muscle tightening and stiffness, usually all over the body. It can lead to tightening of  muscles in the head and neck so you can't open your mouth, swallow, or sometimes even breathe. Tetanus kills about 1 out of 5 people who are infected. DIPHTHERIA can cause a thick coating to form in the back of the throat. It can lead to breathing problems, paralysis, heart failure, and death. PERTUSSIS (Whooping Cough) causes severe coughing spells, which can cause difficulty breathing, vomiting and disturbed sleep. It can also lead to weight loss, incontinence, and rib fractures. Up to 2 in 100 adolescents and 5 in 100 adults with pertussis are hospitalized or have complications, which could include pneumonia and death. These diseases are caused by bacteria. Diphtheria and pertussis are spread from person to person through coughing or sneezing. Tetanus enters the body through cuts, scratches, or wounds. Before vaccines, the [place] saw as many as 200,000 cases a year of diphtheria and pertussis, and hundreds of cases of tetanus. Since vaccination began, tetanus and diphtheria have  dropped by about 99% and pertussis by about 80%. TDAP VACCINE Tdap vaccine can protect adolescents and adults from tetanus, diphtheria, and pertussis. One dose of Tdap is routinely given at age 84 or 8. People who did not get Tdap at that age should get it as soon as possible. Tdap is especially important for health care professionals and anyone having close contact with a baby younger than 12 months. Pregnant women should get a dose of Tdap during every pregnancy, to protect the newborn from pertussis. Infants are most at risk for severe, life-threatening complications from pertussis. A similar vaccine, called Td, protects from tetanus and diphtheria, but not pertussis. A Td booster should be given every 10 years. Tdap may be given as one of these boosters if you have not already gotten a dose. Tdap may also be given after a severe cut or burn to prevent tetanus infection. Your doctor can give you more information. Tdap may safely be given at the same time as other vaccines. SOME PEOPLE SHOULD NOT GET THIS VACCINE If you ever had a life-threatening allergic reaction after a dose of any tetanus, diphtheria, or pertussis containing vaccine, OR if you have a severe allergy to any part of this vaccine, you should not get Tdap. Tell your doctor if you have any severe allergies. If you had a coma, or long or multiple seizures within 7 days after a childhood dose of DTP or DTaP, you should not get Tdap, unless a cause other than the vaccine was found. You can still get Td. Talk to your doctor if you: have epilepsy or another nervous system problem, had severe pain or swelling after any vaccine containing diphtheria, tetanus or pertussis, ever had Guillain-Barr Syndrome (GBS), aren't feeling well on the day the shot is scheduled. RISKS OF A VACCINE REACTION With any medicine, including vaccines, there is a chance of side effects. These are usually mild and go away on their own, but serious reactions are  also possible. Brief fainting spells can follow a vaccination, leading to injuries from falling. Sitting or lying down for about 15 minutes can help prevent these. Tell your doctor if you feel dizzy or light-headed, or have vision changes or ringing in the ears. Mild problems following Tdap (Did not interfere with activities) Pain where the shot was given (about 3 in 4 adolescents or 2 in 3 adults) Redness or swelling where the shot was given (about 1 person in 5) Mild fever of at least 100.58F (up to about 1 in 25 adolescents or 1 in 100  adults) Headache (about 3 or 4 people in 10) Tiredness (about 1 person in 3 or 4) Nausea, vomiting, diarrhea, stomach ache (up to 1 in 4 adolescents or 1 in 10 adults) Chills, body aches, sore joints, rash, swollen glands (uncommon) Moderate problems following Tdap (Interfered with activities, but did not require medical attention) Pain where the shot was given (about 1 in 5 adolescents or 1 in 100 adults) Redness or swelling where the shot was given (up to about 1 in 16 adolescents or 1 in 25 adults) Fever over 102F (about 1 in 100 adolescents or 1 in 250 adults) Headache (about 3 in 20 adolescents or 1 in 10 adults) Nausea, vomiting, diarrhea, stomach ache (up to 1 or 3 people in 100) Swelling of the entire arm where the shot was given (up to about 3 in 100). Severe problems following Tdap (Unable to perform usual activities, required medical attention) Swelling, severe pain, bleeding and redness in the arm where the shot was given (rare). A severe allergic reaction could occur after any vaccine (estimated less than 1 in a million doses). WHAT IF THERE IS A SERIOUS REACTION? What should I look for? Look for anything that concerns you, such as signs of a severe allergic reaction, very high fever, or behavior changes. Signs of a severe allergic reaction can include hives, swelling of the face and throat, difficulty breathing, a fast heartbeat, dizziness, and  weakness. These would start a few minutes to a few hours after the vaccination. What should I do? If you think it is a severe allergic reaction or other emergency that can't wait, call 9-1-1 or get the person to the nearest hospital. Otherwise, call your doctor. Afterward, the reaction should be reported to the "Vaccine Adverse Event Reporting System" (VAERS). Your doctor might file this report, or you can do it yourself through the VAERS web site at www.vaers.SamedayNews.es, or by calling 220-738-5106. VAERS is only for reporting reactions. They do not give medical advice.  THE NATIONAL VACCINE INJURY COMPENSATION PROGRAM The National Vaccine Injury Compensation Program (VICP) is a federal program that was created to compensate people who may have been injured by certain vaccines. Persons who believe they may have been injured by a vaccine can learn about the program and about filing a claim by calling 314-704-8524 or visiting the [place] website at GoldCloset.com.ee. HOW CAN I LEARN MORE? Ask your doctor. Call your local or state health department. Contact the Centers for Disease Control and Prevention (CDC): Call 7028385399 or visit CDC's website at http://hunter.com/. CDC Tdap Vaccine VIS (05/14/11) Document Released: 06/23/2011 Document Revised: 04/18/2012 Document Reviewed: 04/13/2012 [employer] Patient Information 2015 [place], [place]. This information is not intended to replace advice given to you by your health care provider. Make sure you discuss any questions you have with your health care provider.

## 2013-07-13 NOTE — Patient Instructions (Addendum)
You should receive a call or letter about your lab results within the next week to 10 days.  We will call you for follow up appointment with Dr. Brigitte Pulse.  Can take 1/2 tablet of klonopin as needed at bedtime.  Increase fluid intake during the day to lessen chance of dizziness with siting up - if this persists - return for recheck. Return to the clinic or go to the nearest emergency room if any of your symptoms worsen or new symptoms occur. metrogel ointment for rosacea. Call your dermatologist for other medication and to schedule follow up appointment.  Ok to apply over the counter hydrocortisone to rash on arm. If rash spreads - return to clinic.  tdap updated today.  Over the counter clotrimazole or lotrimin cream twice per day to groin rash - if not improved in 3-4 weeks, return for recheck.   Rosacea Rosacea is a long-term (chronic) condition that affects the skin of the face (cheeks, nose, brow, and chin) and sometimes the eyes. Rosacea causes the blood vessels near the surface of the skin to enlarge, resulting in redness. This condition usually begins after age 43. It occurs most often in light-skinned women. Without treatment, rosacea tends to get worse over time. There is no cure for rosacea, but treatment can help control your symptoms. CAUSES  The cause is unknown. It is thought that some people may inherit a tendency to develop rosacea. Certain triggers can make your rosacea worse, including:  Hot baths.  Exercise.  Sunlight.  Very hot or cold temperatures.  Hot or spicy foods and drinks.  Drinking alcohol.  Stress.  Taking blood pressure medicine.  Long-term use of topical steroids on the face. SYMPTOMS   Redness of the face.  Red bumps or pimples on the face.  Red, enlarged nose (rhinophyma).  Blushing easily.  Red lines on the skin.  Irritated or burning feeling in the eyes.  Swollen eyelids. DIAGNOSIS  Your caregiver can usually tell what is wrong by asking  about your symptoms and performing a physical exam. TREATMENT  Avoiding triggers is an important part of treatment. You will also need to see a skin specialist (dermatologist) who can develop a treatment plan for you. The goals of treatment are to control your condition and to improve the appearance of your skin. It may take several weeks or months of treatment before you notice an improvement in your skin. Even after your skin improves, you will likely need to continue treatment to prevent your rosacea from coming back. Treatment methods may include:  Using sunscreen or sunblock daily to protect the skin.  Antibiotic medicine, such as metronidazole, applied directly to the skin.  Antibiotics taken by mouth. This is usually prescribed if you have eye problems from your rosacea.  Laser surgery to improve the appearance of the skin. This surgery can reduce the appearance of red lines on the skin and can remove excess tissue from the nose to reduce its size. HOME CARE INSTRUCTIONS  Avoid things that seem to trigger your flare-ups.  If you are given antibiotics, take them as directed. Finish them even if you start to feel better.  Use a gentle facial cleanser that does not contain alcohol.  You may use a mild facial moisturizer.  Use a sunscreen or sunblock with SPF 30 or greater.  Wear a green-tinted foundation powder to conceal redness, if needed. Choose cosmetics that are noncomedogenic. This means they do not block your pores.  If your eyelids are affected,  apply warm compresses to the eyelids. Do this up to 4 times a day or as directed by your caregiver. SEEK MEDICAL CARE IF:  Your skin problems get worse.  You feel depressed.  You lose your appetite.  You have trouble concentrating.  You have problems with your eyes, such as redness or itching. MAKE SURE YOU:  Understand these instructions.  Will watch your condition.  Will get help right away if you are not doing well or  get worse. Document Released: 01/30/2004 Document Revised: 06/23/2011 Document Reviewed: 12/02/2010 [facility] Patient Information 2015 [place], [place]. This information is not intended to replace advice given to you by your health care provider. Make sure you discuss any questions you have with your health care provider.  Keeping you healthy  Get these tests  Blood pressure- Have your blood pressure checked once a year by your healthcare provider.  Normal blood pressure is 120/80  Weight- Have your body mass index (BMI) calculated to screen for obesity.  BMI is a measure of body fat based on height and weight. You can also calculate your own BMI at [email or web address].  Cholesterol- Have your cholesterol checked every year.  Diabetes- Have your blood sugar checked regularly if you have high blood pressure, high cholesterol, have a family history of diabetes or if you are overweight.  Screening for Colon Cancer- Colonoscopy starting at age 62.  Screening may begin sooner depending on your family history and other health conditions. Follow up colonoscopy as directed by your Gastroenterologist.  Screening for Prostate Cancer- Both blood work (PSA) and a rectal exam help screen for Prostate Cancer.  Screening begins at age 13 with African-American men and at age 62 with Caucasian men.  Screening may begin sooner depending on your family history.  Take these medicines  Aspirin- One aspirin daily can help prevent Heart disease and Stroke.  Flu shot- Every fall.  Tetanus- Every 10 years.  Zostavax- Once after the age of 85 to prevent Shingles.  Pneumonia shot- Once after the age of 81; if you are younger than 26, ask your healthcare provider if you need a Pneumonia shot.  Take these steps  Don't smoke- If you do smoke, talk to your doctor about quitting.  For tips on how to quit, go to www.smokefree.gov or call 1-800-QUIT-NOW.  Be physically active- Exercise 5 days a week for at  least 30 minutes.  If you are not already physically active start slow and gradually work up to 30 minutes of moderate physical activity.  Examples of moderate activity include walking briskly, mowing the yard, dancing, swimming, bicycling, etc.  Eat a healthy diet- Eat a variety of healthy food such as fruits, vegetables, low fat milk, low fat cheese, yogurt, lean meant, poultry, fish, beans, tofu, etc. For more information go to www.thenutritionsource.org  Drink alcohol in moderation- Limit alcohol intake to less than two drinks a day. Never drink and drive.  Dentist- Brush and floss twice daily; visit your dentist twice a year.  Depression- Your emotional health is as important as your physical health. If you're feeling down, or losing interest in things you would normally enjoy please talk to your healthcare provider.  Eye exam- Visit your eye doctor every year.  Safe sex- If you may be exposed to a sexually transmitted infection, use a condom.  Seat belts- Seat belts can save your life; always wear one.  Smoke/Carbon Monoxide detectors- These detectors need to be installed on the appropriate level of your home.  Replace  batteries at least once a year.  Skin cancer- When out in the sun, cover up and use sunscreen 15 SPF or higher.  Violence- If anyone is threatening you, please tell your healthcare provider. Living Will/ Health care power of attorney- Speak with your healthcare provider and family.Tetanus, Diphtheria, Pertussis (Tdap) Vaccine What You Need to Know WHY GET VACCINATED? Tetanus, diphtheria and pertussis can be very serious diseases, even for adolescents and adults. Tdap vaccine can protect [place] from these diseases. TETANUS (Lockjaw) causes painful muscle tightening and stiffness, usually all over the body. It can lead to tightening of muscles in the head and neck so you can't open your mouth, swallow, or sometimes even breathe. Tetanus kills about 1 out of 5 people who are  infected. DIPHTHERIA can cause a thick coating to form in the back of the throat. It can lead to breathing problems, paralysis, heart failure, and death. PERTUSSIS (Whooping Cough) causes severe coughing spells, which can cause difficulty breathing, vomiting and disturbed sleep. It can also lead to weight loss, incontinence, and rib fractures. Up to 2 in 100 adolescents and 5 in 100 adults with pertussis are hospitalized or have complications, which could include pneumonia and death. These diseases are caused by bacteria. Diphtheria and pertussis are spread from person to person through coughing or sneezing. Tetanus enters the body through cuts, scratches, or wounds. Before vaccines, the [place] saw as many as 200,000 cases a year of diphtheria and pertussis, and hundreds of cases of tetanus. Since vaccination began, tetanus and diphtheria have dropped by about 99% and pertussis by about 80%. TDAP VACCINE Tdap vaccine can protect adolescents and adults from tetanus, diphtheria, and pertussis. One dose of Tdap is routinely given at age 40 or 54. People who did not get Tdap at that age should get it as soon as possible. Tdap is especially important for health care professionals and anyone having close contact with a baby younger than 12 months. Pregnant women should get a dose of Tdap during every pregnancy, to protect the newborn from pertussis. Infants are most at risk for severe, life-threatening complications from pertussis. A similar vaccine, called Td, protects from tetanus and diphtheria, but not pertussis. A Td booster should be given every 10 years. Tdap may be given as one of these boosters if you have not already gotten a dose. Tdap may also be given after a severe cut or burn to prevent tetanus infection. Your doctor can give you more information. Tdap may safely be given at the same time as other vaccines. SOME PEOPLE SHOULD NOT GET THIS VACCINE If you ever had a life-threatening  allergic reaction after a dose of any tetanus, diphtheria, or pertussis containing vaccine, OR if you have a severe allergy to any part of this vaccine, you should not get Tdap. Tell your doctor if you have any severe allergies. If you had a coma, or long or multiple seizures within 7 days after a childhood dose of DTP or DTaP, you should not get Tdap, unless a cause other than the vaccine was found. You can still get Td. Talk to your doctor if you: have epilepsy or another nervous system problem, had severe pain or swelling after any vaccine containing diphtheria, tetanus or pertussis, ever had Guillain-Barr Syndrome (GBS), aren't feeling well on the day the shot is scheduled. RISKS OF A VACCINE REACTION With any medicine, including vaccines, there is a chance of side effects. These are usually mild and go away on their own, but  serious reactions are also possible. Brief fainting spells can follow a vaccination, leading to injuries from falling. Sitting or lying down for about 15 minutes can help prevent these. Tell your doctor if you feel dizzy or light-headed, or have vision changes or ringing in the ears. Mild problems following Tdap (Did not interfere with activities) Pain where the shot was given (about 3 in 4 adolescents or 2 in 3 adults) Redness or swelling where the shot was given (about 1 person in 5) Mild fever of at least 100.39F (up to about 1 in 25 adolescents or 1 in 100 adults) Headache (about 3 or 4 people in 10) Tiredness (about 1 person in 3 or 4) Nausea, vomiting, diarrhea, stomach ache (up to 1 in 4 adolescents or 1 in 10 adults) Chills, body aches, sore joints, rash, swollen glands (uncommon) Moderate problems following Tdap (Interfered with activities, but did not require medical attention) Pain where the shot was given (about 1 in 5 adolescents or 1 in 100 adults) Redness or swelling where the shot was given (up to about 1 in 16 adolescents or 1 in 25 adults) Fever over  102F (about 1 in 100 adolescents or 1 in 250 adults) Headache (about 3 in 20 adolescents or 1 in 10 adults) Nausea, vomiting, diarrhea, stomach ache (up to 1 or 3 people in 100) Swelling of the entire arm where the shot was given (up to about 3 in 100). Severe problems following Tdap (Unable to perform usual activities, required medical attention) Swelling, severe pain, bleeding and redness in the arm where the shot was given (rare). A severe allergic reaction could occur after any vaccine (estimated less than 1 in a million doses). WHAT IF THERE IS A SERIOUS REACTION? What should I look for? Look for anything that concerns you, such as signs of a severe allergic reaction, very high fever, or behavior changes. Signs of a severe allergic reaction can include hives, swelling of the face and throat, difficulty breathing, a fast heartbeat, dizziness, and weakness. These would start a few minutes to a few hours after the vaccination. What should I do? If you think it is a severe allergic reaction or other emergency that can't wait, call 9-1-1 or get the person to the nearest hospital. Otherwise, call your doctor. Afterward, the reaction should be reported to the "Vaccine Adverse Event Reporting System" (VAERS). Your doctor might file this report, or you can do it yourself through the VAERS web site at www.vaers.SamedayNews.es, or by calling (909) 273-2031. VAERS is only for reporting reactions. They do not give medical advice.  THE NATIONAL VACCINE INJURY COMPENSATION PROGRAM The National Vaccine Injury Compensation Program (VICP) is a federal program that was created to compensate people who may have been injured by certain vaccines. Persons who believe they may have been injured by a vaccine can learn about the program and about filing a claim by calling (302)325-5357 or visiting the [place] website at GoldCloset.com.ee. HOW CAN I LEARN MORE? Ask your doctor. Call your local or state health  department. Contact the Centers for Disease Control and Prevention (CDC): Call (720)013-3865 or visit CDC's website at http://hunter.com/. CDC Tdap Vaccine VIS (05/14/11) Document Released: 06/23/2011 Document Revised: 04/18/2012 Document Reviewed: 04/13/2012 [employer] Patient Information 2015 [place], [place]. This information is not intended to replace advice given to you by your health care provider. Make sure you discuss any questions you have with your health care provider.

## 2013-07-14 LAB — T.PALLIDUM AB, TOTAL: T pallidum Antibodies (TP-PA): 0.11 S/CO (ref <0.90)

## 2013-07-14 LAB — GC/CHLAMYDIA PROBE AMP
CT Probe RNA: NEGATIVE
GC Probe RNA: NEGATIVE

## 2013-07-14 LAB — PSA: PSA: 1.22 ng/mL (ref <=4.00)

## 2013-07-14 NOTE — Progress Notes (Signed)
Scheduled an appointment with Dr.Shaw on 09/15/13 at 315.

## 2013-07-17 ENCOUNTER — Encounter: Payer: Self-pay | Admitting: Neurology

## 2013-07-17 ENCOUNTER — Ambulatory Visit (INDEPENDENT_AMBULATORY_CARE_PROVIDER_SITE_OTHER): Payer: BC Managed Care – PPO | Admitting: Neurology

## 2013-07-17 ENCOUNTER — Encounter (INDEPENDENT_AMBULATORY_CARE_PROVIDER_SITE_OTHER): Payer: Self-pay

## 2013-07-17 VITALS — BP 121/82 | HR 86 | Resp 16 | Ht 70.5 in | Wt 210.0 lb

## 2013-07-17 DIAGNOSIS — G4733 Obstructive sleep apnea (adult) (pediatric): Secondary | ICD-10-CM

## 2013-07-17 DIAGNOSIS — Z9989 Dependence on other enabling machines and devices: Secondary | ICD-10-CM

## 2013-07-17 DIAGNOSIS — G4700 Insomnia, unspecified: Secondary | ICD-10-CM

## 2013-07-17 DIAGNOSIS — G4730 Sleep apnea, unspecified: Secondary | ICD-10-CM

## 2013-07-17 MED ORDER — ZALEPLON 10 MG PO CAPS: 10.0000 mg | ORAL_CAPSULE | Freq: Every evening | ORAL | Status: DC | PRN

## 2013-07-17 NOTE — Patient Instructions (Signed)
Insomnia Insomnia is frequent trouble falling and/or staying asleep. Insomnia can be a long term problem or a short term problem. Both are common. Insomnia can be a short term problem when the wakefulness is related to a certain stress or worry. Long term insomnia is often related to ongoing stress during waking hours and/or poor sleeping habits. Overtime, sleep deprivation itself can make the problem worse. Every little thing feels more severe because you are overtired and your ability to cope is decreased. CAUSES   Stress, anxiety, and depression.  Poor sleeping habits.  Distractions such as TV in the bedroom.  Naps close to bedtime.  Engaging in emotionally charged conversations before bed.  Technical reading before sleep.  Alcohol and other sedatives. They may make the problem worse. They can hurt normal sleep patterns and normal dream activity.  Stimulants such as caffeine for several hours prior to bedtime.  Pain syndromes and shortness of breath can cause insomnia.  Exercise late at night.  Changing time zones may cause sleeping problems (jet lag). It is sometimes helpful to have someone observe your sleeping patterns. They should look for periods of not breathing during the night (sleep apnea). They should also look to see how long those periods last. If you live alone or observers are uncertain, you can also be observed at a sleep clinic where your sleep patterns will be professionally monitored. Sleep apnea requires a checkup and treatment. Give your caregivers your medical history. Give your caregivers observations your family has made about your sleep.  SYMPTOMS   Not feeling rested in the morning.  Anxiety and restlessness at bedtime.  Difficulty falling and staying asleep. TREATMENT   Your caregiver may prescribe treatment for an underlying medical disorders. Your caregiver can give advice or help if you are using alcohol or other drugs for self-medication. Treatment  of underlying problems will usually eliminate insomnia problems.  Medications can be prescribed for short time use. They are generally not recommended for lengthy use.  Over-the-counter sleep medicines are not recommended for lengthy use. They can be habit forming.  You can promote easier sleeping by making lifestyle changes such as:  Using relaxation techniques that help with breathing and reduce muscle tension.  Exercising earlier in the day.  Changing your diet and the time of your last meal. No night time snacks.  Establish a regular time to go to bed.  Counseling can help with stressful problems and worry.  Soothing music and white noise may be helpful if there are background noises you cannot remove.  Stop tedious detailed work at least one hour before bedtime. HOME CARE INSTRUCTIONS   Keep a diary. Inform your caregiver about your progress. This includes any medication side effects. See your caregiver regularly. Take note of:  Times when you are asleep.  Times when you are awake during the night.  The quality of your sleep.  How you feel the next day. This information will help your caregiver care for you.  Get out of bed if you are still awake after 15 minutes. Read or do some quiet activity. Keep the lights down. Wait until you feel sleepy and go back to bed.  Keep regular sleeping and waking hours. Avoid naps.  Exercise regularly.  Avoid distractions at bedtime. Distractions include watching television or engaging in any intense or detailed activity like attempting to balance the household checkbook.  Develop a bedtime ritual. Keep a familiar routine of bathing, brushing your teeth, climbing into bed at the same   time each night, listening to soothing music. Routines increase the success of falling to sleep faster.  Use relaxation techniques. This can be using breathing and muscle tension release routines. It can also include visualizing peaceful scenes. You can  also help control troubling or intruding thoughts by keeping your mind occupied with boring or repetitive thoughts like the old concept of counting sheep. You can make it more creative like imagining planting one beautiful flower after another in your backyard garden.  During your day, work to eliminate stress. When this is not possible use some of the previous suggestions to help reduce the anxiety that accompanies stressful situations. MAKE SURE YOU:   Understand these instructions.  Will watch your condition.  Will get help right away if you are not doing well or get worse. Document Released: 12/20/1999 Document Revised: 03/16/2011 Document Reviewed: 01/19/2007 [employer] Patient Information 2015 [employer]. This information is not intended to replace advice given to you by your health care provider. Make sure you discuss any questions you have with your health care provider.  

## 2013-07-17 NOTE — Progress Notes (Signed)
[facility]  Provider:  Larey Seat, M D  Referring Provider: Shawnee Knapp, MD Primary Care Physician:  Delman Cheadle, MD  OSA untreated, insomnia and hypersomnia.     Interim History:   Richard Reyes returned for a split night polysomnography on 06-02-13 he had endorsed the Epworth score at 12 points and sleepy HQ form for depression and 8 points. The patient's AHI was 47.3 and RDI 51.2. The patient had no PLMs of sleep . Average heart rate remained in normal sinus rhythm 67 beats per minute.  He was titrated to 8 cm water with a reduction of the AHI to 1.6. Supine AHI was still around 9 versus nonsupine AHI of 1.2 .  I have new compliance report available today dated 7-12/15 showing 100% compliance for the patient average time of use of 7hours and 4 minutes at night 8 cm water pressure this 1 cm EPI or residual AHI is 2.3. The patient is a mouth breather but has a nasal pillow as well as a fullface mask available and uses a nasal spray.  His Epworth sleepiness score today is 8 points from previously 12 his fatigue severity score of 33 points .   He is still complaining about insomnia, but less than prior to CPAP use, he is more alert, no nocturia. He tends to wake up early. I suggested a trial of SONATA.    HPI:  Richard Reyes is a 48 y.o.  right handed, single, caucasian male, who is seen here as a referral from Dr. Brigitte Pulse for consultation. Richard Reyes reports that about 8 years ago he had a urinary tract infection causing him to have nocturia or urinary frequency interrupting her sleep. Until then he had never had trouble to initiate or maintain sleep. On the contrary enough to sleep 10 hours and he felt his sleep was refreshing and restoring. He was given Lunesta to  sleep through the night while having nocturia, was waiting for a Urology appointment. He became insomnic. He was referred to [facility] for a Sleep study , was diagnosed with OSA and prescribed CPAP.  He had d gained  30 pounds while newly insomnic. He never tolerated the CPAP and FFM, was prescribed Ambien - made phone calls and rearranged furniture , became day time forgetfully, amnestic for conversations. He ow is unemployed, feels the memory is a severe handicap to his re- employment.  He now looks for a sleep apnea treatment, a new way to be healthier, fitter and more alert.    His bed time 10.30PM , he dims the light 30 minute before.  His sleep latency often is 60 minutes or longer -  No TV no reading in bed. He goes to the bathroom 2-3 times, he rarely dreams.  He drinks no alcohol, no longer smoking, no caffeine after lunch. When he wakes up at 7.30 (when he left for work, he is now unemployed but keeps his routine) - he is neither refreshed or restored- he feels tired , he doesn't nap. He no longer drinks caffeine in AM. He was exposed to natural daylight.  He gained weight over the last year after a foot injury, close to 20 pounds.     He has a nasal septal deviation restricting airflow, has rhinitis, he has no retrognathia , but a crowded jaw.    Review of Systems: Out of a complete 14 system review, the patient complains of only the following symptoms, and all other reviewed systems are negative. FSS 30,  Epworth 10 , snoring, weight gain.   History   Social History  . Marital Status: Single    Spouse Name: N/A    Number of Children: 0  . Years of Education: 16   Occupational History  .  [employer]   Social History Main Topics  . Smoking status: Former Smoker    Quit date: 05/18/2007  . Smokeless tobacco: Never Used  . Alcohol Use: Yes     Comment: rarely  . Drug Use: No  . Sexual Activity: Not on file   Other Topics Concern  . Not on file   Social History Narrative   Patient is single and lives alone.   Patient has a college education.   Patient is unemployed.   Patient is right-handed.   Patient does not drink any caffeine.    Family History  Problem Relation Age  of Onset  . Cancer Maternal Grandmother     Past Medical History  Diagnosis Date  . Anxiety   . OSA on CPAP   . Rhinitis, allergic   . Insomnia w/ sleep apnea     Past Surgical History  Procedure Laterality Date  . Nose surgery      Current Outpatient Prescriptions  Medication Sig Dispense Refill  . clonazePAM (KLONOPIN) 0.5 MG tablet Take 0.25 mg by mouth at bedtime as needed (for sleep).      . metroNIDAZOLE (METROGEL) 1 % gel Apply topically daily.  60 g  1  . mometasone (NASONEX) 50 MCG/ACT nasal spray Place 2 sprays into the nose daily.  17 g  1  . Nitroglycerin 0.4 % OINT Apply 1 inch intra-anally q12 hrs up to 3 wks  30 g  1  . nystatin-triamcinolone (MYCOLOG II) cream       . PROAIR HFA 108 (90 BASE) MCG/ACT inhaler        No current facility-administered medications for this visit.    Allergies as of 07/17/2013 - Review Complete 07/17/2013  Allergen Reaction Noted  . Sulfonamide derivatives  09/15/2007    Vitals: BP 121/82  Pulse 86  Resp 16  Ht 5' 10.5" (1.791 m)  Wt 210 lb (95.255 kg)  BMI 29.70 kg/m2 Last Weight:  Wt Readings from Last 1 Encounters:  07/17/13 210 lb (95.255 kg)   Last Height:   Ht Readings from Last 1 Encounters:  07/17/13 5' 10.5" (1.791 m)    Physical exam:  General: The patient is awake, alert and appears not in acute distress. The patient is well groomed. Head: Normocephalic, atraumatic. Neck is supple. Mallampati 2, with a very prolonged uvula- , neck circumference: 18 inches.  Cardiovascular:  Regular rate and rhythm, without  murmurs or carotid bruit, and without distended neck veins. Respiratory: Lungs are clear to auscultation. Skin:  Without evidence of edema, or rash, facial erythema. Puffiness.  Trunk: BMI is  elevated and patient  has normal posture.  Neurologic exam : The patient is awake and alert, oriented to place and time.  Memory subjective  described as intact.  There is a normal attention span &  concentration ability.  Speech is fluent without  dysarthria, dysphonia or aphasia. Mood and affect are appropriate.  Cranial nerves: Pupils are equal and briskly reactive to light. Funduscopic exam without  evidence of pallor or edema. Extraocular movements  in vertical and horizontal planes intact and without nystagmus. Visual fields by finger perimetry are intact. Hearing to finger rub intact.  Facial sensation intact to fine touch. Facial  motor strength is symmetric and tongue and uvula move midline.  Motor exam:  Normal muscle bulk and symmetric normal strength in all extremities.  Sensory:  Fine touch, pinprick and vibration were  normal.  Coordination: Rapid alternating movements in the fingers/hands is tested and normal.  Finger-to-nose maneuver tested and normal without evidence of ataxia, dysmetria or tremor.  Gait and station: Patient walks without assistive device .  Strength within normal limits. Stance is stable and normal. Tandem gait is unfragmented. Romberg testing is normal.  Deep tendon reflexes: in the  upper and lower extremities are symmetric and intact. Babinski maneuver response is  downgoing.   Assessment:  After physical and neurologic examination, review of laboratory studies, imaging, neurophysiology testing and pre-existing records, assessment is  1) The patient still has OSA, and to a more severe degree  than 7-8 years ago. AHI is severe , above 40. Supine accentuated.  2) insomnia , chronic sleep maintenance and initiation problems.  Sonata.  3)rhinitis- nasocort    Plan:  Treatment plan and additional workup : continue CPAP with nasocort/ nasonex

## 2013-07-18 ENCOUNTER — Encounter: Payer: Self-pay | Admitting: Neurology

## 2013-07-26 ENCOUNTER — Ambulatory Visit (INDEPENDENT_AMBULATORY_CARE_PROVIDER_SITE_OTHER): Payer: BC Managed Care – PPO | Admitting: General Surgery

## 2013-07-26 ENCOUNTER — Encounter (INDEPENDENT_AMBULATORY_CARE_PROVIDER_SITE_OTHER): Payer: Self-pay | Admitting: General Surgery

## 2013-07-26 VITALS — BP 118/76 | HR 72 | Temp 97.9°F | Resp 14 | Ht 69.5 in | Wt 207.8 lb

## 2013-07-26 DIAGNOSIS — K644 Residual hemorrhoidal skin tags: Secondary | ICD-10-CM

## 2013-07-26 DIAGNOSIS — K648 Other hemorrhoids: Secondary | ICD-10-CM

## 2013-07-26 NOTE — Progress Notes (Signed)
Subjective:     Patient ID: Richard Reyes, male   DOB: Jun 11, 1965, 48 y.o.   MRN: [id number]  HPI 48 year old Caucasian male comes in for followup. I initially saw him about 6 weeks ago for hemorrhoidal issues and a small anal fissure. He states since we last saw each other he is significantly improved. He no longer has pain with defecation. He has been using Metamucil daily. He also drinks lots of water daily. He no longer has any significant itching or burning. It is significantly improved. He had one episode of bleeding on the toilet paper. He still states that it is occasionally hard to clean the area due to the hemorrhoidal tissue.  PMHx, PSHx, SOCHx, FAMHx, ALL reviewed and unchanged  Review of Systems A 10 point Review of systems was performed and all systems are negative except for what is mentioned in the history of present illness     Objective:   Physical Exam  Constitutional: He is oriented to person, place, and time. He appears well-developed and well-nourished. No distress.  HENT:  Head: Normocephalic and atraumatic.  Right Ear: External ear normal.  Left Ear: External ear normal.  Eyes: Conjunctivae are normal. No scleral icterus.  Neck: No tracheal deviation present.  Cardiovascular: Normal rate and normal heart sounds.   Pulmonary/Chest: Effort normal and breath sounds normal. No respiratory distress. He has no wheezes.  Abdominal: Soft. He exhibits no distension.  Genitourinary:     Redundant non-thrombosed external hemorrhoidal tissue in the left and right posterior lateral position. Anal fissure resolved. Digital rectal exam reveals good tone. Anoscopy shows right anterior internal hemorrhoid grade 1  Musculoskeletal: Normal range of motion. He exhibits no edema.  Neurological: [name], place, and time. He exhibits normal muscle tone.  Skin: Skin is warm and dry. No rash noted. He is not diaphoretic. No erythema. No pallor.  Psychiatric:  He has a normal mood and affect. His behavior is normal. Judgment and thought content normal.       Assessment:     Redundant nonthrombosed left and right posterior lateral external hemorrhoidal tissue Grade 1 right anterior internal hemorrhoid     Plan:     We again had a prolonged conversation about management of hemorrhoidal problems. I am pleased that his anal fissure has resolved. I encouraged him to continue with drinking lots of water, supplemental fiber intake, and proper bathroom hygiene. We discussed ongoing observation since he has improved or excision of the redundant external hemorrhoidal tissue with banding of the grade 1 internal hemorrhoid. I advised him that area would never be flat  I discussed the procedure in detail.  The patient was given Neurosurgeon.  We discussed the risks and benefits of surgery including, but not limited to bleeding, infection, blood clot formation, anesthesia risk, urinary retention, hemorrhoid recurrence, injury to the sphincters resulting in incontinence, and the rare possibility of anal canal narrowing. I explained that the likelihood of improvement of their symptoms is good  We discussed the typical postoperative course.  I stressed the importance of not becoming constipated after surgery.  The patient was encouraged to limit pain medication if possible as this increases the likelihood of becoming constipated. The patient was advised to take stool softners & drink 8-10 glasses of non-carbonated, non-alcoholic beverages per day and to eat a high fiber diet.  I also encouraged soaking in a water warm bath for 15 minutes at a time several times a day and after  a bowel movement.  The patient was advised to take laxatives such as milk of magnesia or Miralax if no bowel movement three days after surgery.  The patient was advised to expect some blood tinged drainage as well as some blood in their bowel movements.   He is interested in surgery but  wants to think about it for a day or 2. I encouraged him to contact our office should he decide to proceed with surgery. All of his questions were asked and answered.  Leighton Ruff. Redmond Pulling, MD, FACS General, Bariatric, & Minimally Invasive Surgery [facility], [place]

## 2013-07-26 NOTE — Patient Instructions (Signed)
Continue current bowel regimen  Hemorrhoidectomy Care After Hemorrhoidectomy is the removal of enlarged (dilated) veins around the rectum. Until the surgical areas are healed, control of pain and avoiding constipation are the greatest challenges for patients.  For as long as 24 hours after receiving an anesthetic (the medication that made you sleep), and while taking narcotic pain relievers, you may feel dizzy, weak and drowsy. For that reason, the following information applies to the first 24-hour period following surgery, and continues for as long as you are taking narcotic pain medications.  Do not drive a car, ride a bicycle, participate in activities in which you could be hurt. Do not take public transportation until you are off narcotic pain medications and until your caregiver says it is okay.  Do not drink alcohol, take tranquilizers, or medications not prescribed or allowed by your surgical caregiver.  Do not sign important papers or contracts for at least 24 hours or while taking narcotic medications.  Have a responsible person with you for 24 hours. RISKS AND COMPLICATIONS Some problems that may occur following this procedure include:  Infection. A germ starts growing in the tissue surrounding the site operated on. This can usually be treated with antibiotics.  Damage to the rectal sphincter could occur. This is the muscle that opens in your anus to allow a bowel movement. This could cause incontinence. This is uncommon.  Bleeding following surgery can be a complication of almost any surgery. Your surgeon takes every precaution to keep this from happening.  Complications of anesthesia. HOME CARE INSTRUCTIONS  Avoid straining when having bowel movements.  Avoid heavy lifting (more than 10 pounds (4.5 kilograms)).  Only take over-the-counter or prescription medicines for pain, discomfort, or fever as directed by your caregiver.  Take hot sitz baths for 20 to 30 minutes, 3 to  4 times per day.  To keep swelling down, apply an ice pack for twenty minutes three to four times per day between sitz baths. Use a towel between your skin and the ice pack. Do not do this if it causes too much discomfort.  Keep anal area clean and dry. Following a bowel movement, you can gently wash the area with tucks (available for purchase at a drugstore) or cotton swabs. Gently pat the area dry. Do not rub the area.  Eat a well balanced diet and drink 6 to 8 glasses of water every day to avoid constipation. A bulk laxative may be also be helpful. SEEK MEDICAL CARE IF:   You have increasing pain or tenderness near or in the surgical site.  You are unable to eat or drink.  You develop nausea or vomiting.  You develop uncontrolled bleeding such as soaking two to three pads in one hour.  You have constipation, not helped by changing your diet or increasing your fluid intake. Pain medications are a common cause of constipation.  You have pain and redness (inflammation) extending outside the area of your surgery.  You develop an unexplained oral temperature above 102 F (38.9 C), or any other signs of infection.  You have any other questions or concerns following surgery. Document Released: 03/14/2003 Document Revised: 03/16/2011 Document Reviewed: 06/11/2008 [facility] Patient Information 2015 [place], [place]. This information is not intended to replace advice given to you by your health care provider. Make sure you discuss any questions you have with your health care provider.

## 2013-08-04 ENCOUNTER — Telehealth: Payer: Self-pay | Admitting: Family Medicine

## 2013-08-04 ENCOUNTER — Ambulatory Visit (INDEPENDENT_AMBULATORY_CARE_PROVIDER_SITE_OTHER): Payer: BC Managed Care – PPO | Admitting: Internal Medicine

## 2013-08-04 VITALS — BP 122/78 | HR 82 | Temp 98.0°F | Resp 16 | Ht 69.5 in | Wt 207.8 lb

## 2013-08-04 DIAGNOSIS — B354 Tinea corporis: Secondary | ICD-10-CM

## 2013-08-04 DIAGNOSIS — G4700 Insomnia, unspecified: Secondary | ICD-10-CM

## 2013-08-04 DIAGNOSIS — L738 Other specified follicular disorders: Secondary | ICD-10-CM

## 2013-08-04 DIAGNOSIS — L739 Follicular disorder, unspecified: Secondary | ICD-10-CM

## 2013-08-04 DIAGNOSIS — L719 Rosacea, unspecified: Secondary | ICD-10-CM

## 2013-08-04 DIAGNOSIS — R350 Frequency of micturition: Secondary | ICD-10-CM

## 2013-08-04 MED ORDER — CLONAZEPAM 0.5 MG PO TABS: 0.5000 mg | ORAL_TABLET | Freq: Every day | ORAL | Status: DC

## 2013-08-04 MED ORDER — ERYTHROMYCIN 5 MG/GM OP OINT: 1.0000 "application " | TOPICAL_OINTMENT | Freq: Three times a day (TID) | OPHTHALMIC | Status: DC

## 2013-08-04 MED ORDER — KETOCONAZOLE 2 % EX CREA: 1.0000 "application " | TOPICAL_CREAM | Freq: Every day | CUTANEOUS | Status: DC

## 2013-08-04 MED ORDER — KETOCONAZOLE 2 % EX SHAM: 1.0000 "application " | MEDICATED_SHAMPOO | CUTANEOUS | Status: DC

## 2013-08-04 NOTE — Telephone Encounter (Signed)
[facility] called because they cant fill clonazepam because to soon. Patient was telling them that he was told he can take up to 2 pills a day if needed. Please advise directions and if ok to refill early because he is taking or can take  Bid prn

## 2013-08-04 NOTE — Progress Notes (Signed)
Subjective:  This chart was scribed for Dr. Linton Ham. Laney Pastor, MD  by Stacy Gardner, [facility] Scribe. The patient was seen in room and the patient's care was started at 11:54 AM.  Chief Complaint  Patient presents with  . Belepharitis    itches and red x 1 day  . Medication Refill    clonazepam  . Urinary Frequency    x 1 mth and wakes up 3 to 4 times at to urinate     Patient ID: Richard Reyes, male    DOB: 03/05/1965, 48 y.o.   MRN: [id number]  08/04/2013  Belepharitis, Medication Refill and Urinary Frequency   HPI HPI Comments: Richard Reyes is a 48 y.o. male who arrives to the [facility] complaining of blepharitis to his lower eyelid for the past day. He has associated itching and swelling to his lower lid. Denies eye drainage.  He has also complains of urinary frequency, onset one month ago. Denies changes in habit or fluid intake. Denies dysuria, difficulty voiding, or decrease in void volume. Pt reports having frequent UTI's. Pt had a biopsy of his prostate.    Pt requests clonazepam and Metrogel.  Pt reports having a a rash w/ discoloration and itching to his inner thigh. He reports this is a reoccurring issue and he suspect this may be due to excessive diaphoresis to his inner thighs. Pt has infected hair follicles to his scalp. Pt has a hx of rosacea.  Pt has allergies to sulfa.  Patient Active Problem List   Diagnosis Date Noted  . OSA on CPAP 07/17/2013  . Insomnia with sleep apnea, unspecified 07/17/2013  . Hypersomnia 06/02/2013  . BACK PAIN, LUMBAR 04/16/2008  . OBSTRUCTIVE SLEEP APNEA 11/15/2007  . OTHER NONSPECIFIC ABNORMAL SERUM ENZYME LEVELS 10/19/2007  . DEPRESSION 10/09/2007  . TESTOSTERONE DEFICIENCY 10/06/2007  . PURE HYPERCHOLESTEROLEMIA 10/06/2007  . TOBACCO ABUSE 10/06/2007  . INSOMNIA 10/06/2007    Past Surgical History  Procedure Laterality Date  . Nose surgery     History   Social History    . Marital Status: Single    Spouse Name: N/A    Number of Children: 0  . Years of Education: 16   Occupational History  .  [employer]   Social History Main Topics  . Smoking status: Former Smoker    Quit date: 05/18/2007  . Smokeless tobacco: Never Used  . Alcohol Use: Yes     Comment: rarely  . Drug Use: No  . Sexual Activity: Not on file   Other Topics Concern  . Not on file   Social History Narrative   Patient is single and lives alone.   Patient has a college education.   Patient is unemployed.   Patient is right-handed.   Patient does not drink any caffeine.   Family History  Problem Relation Age of Onset  . Cancer Maternal Grandmother    Allergies  Allergen Reactions  . Sulfonamide Derivatives    Current Outpatient Prescriptions  Medication Sig Dispense Refill  . clonazePAM (KLONOPIN) 0.5 MG tablet       . metroNIDAZOLE (METROGEL) 1 % gel Apply topically daily.  60 g  1  . PROAIR HFA 108 (90 BASE) MCG/ACT inhaler       . zaleplon (SONATA) 10 MG capsule Take 1 capsule (10 mg total) by mouth at bedtime as needed for sleep.  90 capsule  0   No current facility-administered medications for  this visit.       Review of Systems  Eyes: Positive for redness and itching. Negative for discharge.  Respiratory: Positive for cough.   Genitourinary: Positive for frequency. Negative for dysuria, decreased urine volume and difficulty urinating.  Skin: Positive for color change and rash.     Objective:     Physical Exam  Nursing note and vitals reviewed. Constitutional: He is oriented to person, place, and time. He appears well-developed and well-nourished.  HENT:  Head: Normocephalic.     Eyes: Conjunctivae and EOM are normal. Pupils are equal, round, and reactive to light.  Right lower lid is swollen and red with a tender nodule w/o drainage The bulbar conjunctiva is non injected  Neck: Normal range of motion.  Cardiovascular: Normal rate.    Pulmonary/Chest: Effort normal. No respiratory distress.  Musculoskeletal: Normal range of motion.  Neurological: [name], place, and time.  Skin: Skin is warm and dry. Rash noted. He is not diaphoretic.  There is hyper pigmentation with irritation of both groins.   Psychiatric: He has a normal mood and affect. His behavior is normal.    Filed Vitals:   08/04/13 1127  BP: 122/78  Pulse: 82  Temp: 98 F (36.7 C)  TempSrc: Oral  Resp: 16  Height: 5' 9.5" (1.765 m)  Weight: 207 lb 12.8 oz (94.257 kg)  SpO2: 99%        Assessment & Plan:  Frequent urination - Plan: POCT UA - Microscopic Only, POCT urinalysis dipstick, CANCELED: GC/Chlamydia Probe Amp  Tinea corporis  Insomnia  Folliculitis  Rosacea  Meds ordered this encounter  Medications  . erythromycin ophthalmic ointment    Sig: Place 1 application into the right eye 3 (three) times daily.    Dispense:  3.5 g    Refill:  0  . ketoconazole (NIZORAL) 2 % shampoo    Sig: Apply 1 application topically 2 (two) times a week.    Dispense:  120 mL    Refill:  1  . ketoconazole (NIZORAL) 2 % cream    Sig: Apply 1 application topically daily.    Dispense:  15 g    Refill:  1  . clonazePAM (KLONOPIN) 0.5 MG tablet    Sig: Take 1 tablet (0.5 mg total) by mouth at bedtime.    Dispense:  30 tablet    Refill:  0   Advised him to choose a PCP for our clinic if he wants to continue to have medication refills   I personally performed the services described in this documentation, which was scribed in my presence. The recorded information has been reviewed and is accurate.

## 2013-08-05 NOTE — Telephone Encounter (Signed)
Approve early this time/but tell patient he needs to pick one of our younger docs as pcp if he wants to remain in our practice and get this med regularly---he has seen Jeani Sow and Brigitte Pulse

## 2013-08-06 NOTE — Telephone Encounter (Signed)
lmom to cb. 

## 2013-08-11 NOTE — Telephone Encounter (Signed)
Spoke to pt, he stated someone has already called him with this information. He states Dr. Brigitte Pulse is his PCP.

## 2013-09-15 ENCOUNTER — Ambulatory Visit (INDEPENDENT_AMBULATORY_CARE_PROVIDER_SITE_OTHER): Payer: BC Managed Care – PPO | Admitting: Family Medicine

## 2013-09-15 ENCOUNTER — Encounter: Payer: Self-pay | Admitting: Family Medicine

## 2013-09-15 VITALS — BP 120/72 | HR 96 | Temp 98.1°F | Resp 18 | Ht 69.0 in | Wt 204.0 lb

## 2013-09-15 DIAGNOSIS — E7800 Pure hypercholesterolemia, unspecified: Secondary | ICD-10-CM

## 2013-09-15 DIAGNOSIS — E291 Testicular hypofunction: Secondary | ICD-10-CM

## 2013-09-15 DIAGNOSIS — Z79899 Other long term (current) drug therapy: Secondary | ICD-10-CM

## 2013-09-15 DIAGNOSIS — L708 Other acne: Secondary | ICD-10-CM

## 2013-09-15 DIAGNOSIS — L719 Rosacea, unspecified: Secondary | ICD-10-CM

## 2013-09-15 DIAGNOSIS — Z1211 Encounter for screening for malignant neoplasm of colon: Secondary | ICD-10-CM

## 2013-09-15 DIAGNOSIS — K649 Unspecified hemorrhoids: Secondary | ICD-10-CM

## 2013-09-15 DIAGNOSIS — Z23 Encounter for immunization: Secondary | ICD-10-CM

## 2013-09-15 DIAGNOSIS — G4700 Insomnia, unspecified: Secondary | ICD-10-CM

## 2013-09-15 MED ORDER — TRETINOIN (EMOLLIENT) 0.05 % EX CREA: TOPICAL_CREAM | CUTANEOUS | Status: DC

## 2013-09-15 MED ORDER — AMITRIPTYLINE HCL 50 MG PO TABS: 50.0000 mg | ORAL_TABLET | Freq: Every evening | ORAL | Status: DC | PRN

## 2013-09-15 MED ORDER — CLONAZEPAM 0.5 MG PO TABS: 0.5000 mg | ORAL_TABLET | Freq: Two times a day (BID) | ORAL | Status: DC | PRN

## 2013-09-15 MED ORDER — METRONIDAZOLE 1 % EX GEL: Freq: Every day | CUTANEOUS | Status: DC

## 2013-09-15 NOTE — Progress Notes (Signed)
Subjective:    Patient ID: Richard Reyes, male    DOB: 08-Aug-1965, 48 y.o.   MRN: [id number] This chart was scribed for Richard Knapp, MD by Richard Reyes, ED Scribe. The patient was seen in Room/bed info not found. The patient's care was started at 4:02 PM.   09/15/2013 Chief Complaint  Patient presents with  . Follow-up    3 month, would like testosteron checked  . Medication Refill     HPI HPI Comments: Richard Reyes is a 48 y.o. male who presents to the [facility] for a follow-up and medication refill. Pt notes he does not take Clonazepam every night but only as needed especially when he feels anxious during the day. Pt has sleep apnea and uses a CPAP every night. Pt notes his insomnia began with Lunesta. Pt is currently taking Testerone 5% cream, 30 mL at a time, with instructions to apply 1 mL to upper arm once daily from [facility]. Pt requests a referral for a colonoscopy. Pt wants to take care of his hemorrhoids with Richard Reyes. Pt has rosacea since age 87, cirrhosis since age 50 and adult acne. Pt takes MetroGel and Retin-A. Pt notes some facial redness and roughness over the eyebrows.  Pt didn't notice any changes with Sonata. He also has taken Trazodone but states he did not notice any improvements.   I saw him several months ago on 05/20/13. He is here for a one month follow-up for his Clonazepam refill. He has a hx of high cholesterol noted but no prior lipid panel. He has an order for future Testerone in his lab but has not had drawn.  Review of Systems  Skin: Positive for color change.  Psychiatric/Behavioral: Positive for sleep disturbance.  All other systems reviewed and are negative.   Past Medical History  Diagnosis Date  . Anxiety   . OSA on CPAP   . Rhinitis, allergic   . Insomnia w/ sleep apnea    Past Surgical History  Procedure Laterality Date  . Nose surgery     Allergies  Allergen Reactions  . Sulfonamide Derivatives     Current Outpatient Prescriptions  Medication Sig Dispense Refill  . clonazePAM (KLONOPIN) 0.5 MG tablet Take 1 tablet (0.5 mg total) by mouth 2 (two) times daily as needed for anxiety.  60 tablet  2  . ketoconazole (NIZORAL) 2 % cream Apply 1 application topically daily.  15 g  1  . ketoconazole (NIZORAL) 2 % shampoo Apply 1 application topically 2 (two) times a week.  120 mL  1  . metroNIDAZOLE (METROGEL) 1 % gel Apply topically daily.  60 g  1  . PROAIR HFA 108 (90 BASE) MCG/ACT inhaler       . zaleplon (SONATA) 10 MG capsule Take 1 capsule (10 mg total) by mouth at bedtime as needed for sleep.  90 capsule  0   No current facility-administered medications for this visit.       Objective:  Triage Vitals: BP 120/72  Pulse 96  Temp(Src) 98.1 F (36.7 C)  Resp 18  Ht 5\' 9"  (1.753 m)  Wt 204 lb (92.534 kg)  BMI 30.11 kg/m2  SpO2 96% Physical Exam  Nursing note and vitals reviewed. Constitutional: He is oriented to person, place, and time. He appears well-developed and well-nourished. No distress.  HENT:  Head: Normocephalic and atraumatic.  Eyes: Conjunctivae and EOM are normal.  Neck: Neck supple. No tracheal deviation present.  Cardiovascular:  Normal rate.   Pulmonary/Chest: Effort normal. No respiratory distress.  Musculoskeletal: Normal range of motion.  Neurological: [name], place, and time.  Skin: Skin is warm and dry. Rash noted. Rash is macular.  Erythematous and rough, macular rash on the face.  Psychiatric: He has a normal mood and affect. His behavior is normal.    Assessment & Plan:  4:17 PM- Patient informed of current plan for treatment and evaluation and agrees with plan at this time.  Need for prophylactic vaccination and inoculation against influenza - Plan: Flu Vaccine QUAD 36+ mos IM  TESTOSTERONE DEFICIENCY - Plan: Testosterone, free, total - pt will RTC next week for early morning fasting testosterone level so I can refill  his testosterone - he is on a compounded cream that he gets from [facility] so had CMA call to his pharmacy and have a refill request for his current dose faxed over for my authorization/sig - ADDENDUM: continue same testosterone dose x 6 mos - then will need repeat labs before further refills.  Pure hypercholesterolemia - Plan: Lipid panel  INSOMNIA - chronic and current clonazepam only partially working - rec trial of melatonin and also start trial of TCA - elavil. Pt has failed Lunesta (made prob much worse), Sonata, and trazodone - and wants something that is non-addictive/habbit forming nor develops a tolerance.  Hemorrhoids, unspecified hemorrhoid type - Plan: Ambulatory referral to General Surgery - pt has failed conservative therapy - requests repeat referral to Richard Reyes as really wants to do hemorrhoidectomy asap  Special screening for malignant neoplasms, colon - Plan: Ambulatory referral to Gastroenterology  Rosacea, acne  Other acne - needs tretinoin for acne - sent to pharmacy  Rosacea - Plan: metroNIDAZOLE (METROGEL) 1 % gel  Encounter for long-term (current) use of other medications - Plan: Comprehensive metabolic panel  Meds ordered this encounter  Medications  . clonazePAM (KLONOPIN) 0.5 MG tablet    Sig: Take 1 tablet (0.5 mg total) by mouth 2 (two) times daily as needed for anxiety.    Dispense:  60 tablet    Refill:  2  . amitriptyline (ELAVIL) 50 MG tablet    Sig: Take 1 tablet (50 mg total) by mouth at bedtime as needed for sleep.    Dispense:  30 tablet    Refill:  2  . metroNIDAZOLE (METROGEL) 1 % gel    Sig: Apply topically daily.    Dispense:  60 g    Refill:  1  . Tretinoin, Facial Wrinkles, (TRETINOIN, EMOLLIENT,) 0.05 % CREA    Sig: Apply qhs prn    Dispense:  60 g    Refill:  5  . ciprofloxacin (CIPRO) 500 MG tablet    Sig:   . tamsulosin (FLOMAX) 0.4 MG CAPS capsule    Sig:     I personally performed the services described in this  documentation, which was scribed in my presence. The recorded information has been reviewed and considered, and addended by me as needed.  Richard Cheadle, MD MPH

## 2013-09-18 ENCOUNTER — Other Ambulatory Visit (INDEPENDENT_AMBULATORY_CARE_PROVIDER_SITE_OTHER): Payer: BC Managed Care – PPO

## 2013-09-18 DIAGNOSIS — Z79899 Other long term (current) drug therapy: Secondary | ICD-10-CM

## 2013-09-18 DIAGNOSIS — E7800 Pure hypercholesterolemia, unspecified: Secondary | ICD-10-CM

## 2013-09-18 DIAGNOSIS — Z23 Encounter for immunization: Secondary | ICD-10-CM

## 2013-09-18 DIAGNOSIS — E291 Testicular hypofunction: Secondary | ICD-10-CM

## 2013-09-18 LAB — COMPREHENSIVE METABOLIC PANEL
ALT: 21 U/L (ref 0–53)
AST: 18 U/L (ref 0–37)
Albumin: 4.3 g/dL (ref 3.5–5.2)
Alkaline Phosphatase: 61 U/L (ref 39–117)
BUN: 11 mg/dL (ref 6–23)
CO2: 28 mEq/L (ref 19–32)
Calcium: 9.4 mg/dL (ref 8.4–10.5)
Chloride: 102 mEq/L (ref 96–112)
Creat: 1.07 mg/dL (ref 0.50–1.35)
Glucose, Bld: 96 mg/dL (ref 70–99)
Potassium: 4.5 mEq/L (ref 3.5–5.3)
Sodium: 140 mEq/L (ref 135–145)
Total Bilirubin: 0.4 mg/dL (ref 0.2–1.2)
Total Protein: 7.1 g/dL (ref 6.0–8.3)

## 2013-09-18 LAB — LIPID PANEL
Cholesterol: 182 mg/dL (ref 0–200)
HDL: 40 mg/dL (ref >39)
LDL Cholesterol: 103 mg/dL — ABNORMAL HIGH (ref 0–99)
Total CHOL/HDL Ratio: 4.6 Ratio
Triglycerides: 194 mg/dL — ABNORMAL HIGH (ref <150)
VLDL: 39 mg/dL (ref 0–40)

## 2013-09-18 NOTE — Progress Notes (Signed)
Pt here for lab draw only visit

## 2013-09-19 LAB — TESTOSTERONE, FREE, TOTAL, SHBG
Sex Hormone Binding: 22 nmol/L (ref 13–71)
Testosterone, Free: 104 pg/mL (ref 47.0–244.0)
Testosterone-% Free: 2.5 % (ref 1.6–2.9)
Testosterone: 412 ng/dL (ref 300–890)

## 2013-09-20 ENCOUNTER — Telehealth: Payer: Self-pay

## 2013-09-20 NOTE — Telephone Encounter (Signed)
[place] sent req to Dr Brigitte Pulse to order testosterone Rx for pt, 5% cream # 90 ml, apply 1 ml (4 clicks) to upper arm once daily as directed.

## 2013-09-20 NOTE — Telephone Encounter (Signed)
Yes fine to refill this for 6 mo supply. thanks

## 2013-09-20 NOTE — Telephone Encounter (Signed)
PA needed for tretinoin cream. Completed form and left for Chelle to sign.

## 2013-09-25 NOTE — Telephone Encounter (Signed)
Unable to locate fax or this cream in 5% strength. Please advise.

## 2013-09-25 NOTE — Telephone Encounter (Signed)
PA denied- Pt needs to be prescribed medication for Acne Vulgaris, actinic keratosis and ichthyoses. Please advise alternative medication to prescribe for this patient.

## 2013-09-25 NOTE — Telephone Encounter (Signed)
This medication has been for his acne vulgaris. He has been maintained on it for a while now. His understanding was that it did not need a PA if it was for acne.  If this is untrue, can we (or the pt) find out what is covered? Any other retinoids? Like adapalene?Thanks. ES

## 2013-09-25 NOTE — Telephone Encounter (Signed)
Can you please call this in on Dr. Raul Del behalf?

## 2013-09-26 NOTE — Telephone Encounter (Signed)
Refaxed form to [facility] with new information that the medication is for acne vulgaris. Pending.

## 2013-09-26 NOTE — Telephone Encounter (Signed)
Script has been called in

## 2013-09-27 DIAGNOSIS — L700 Acne vulgaris: Secondary | ICD-10-CM | POA: Insufficient documentation

## 2013-09-27 NOTE — Telephone Encounter (Signed)
Sent again this morning on [email or web address] with correct dx code. Spoke to the rep at [facility] and the request initially was denied and they need a new PA submitted properly.

## 2013-10-02 NOTE — Telephone Encounter (Signed)
Patient calling to check the status of his "Tretinoin"  Medication being authorized. Patient stated [facility] has the prescription but his insurance [employer] had denied it. Please call patient at [phone]

## 2013-10-04 NOTE — Telephone Encounter (Signed)
PA approved through 09/26/15. Notified pt and pharm.

## 2013-10-10 ENCOUNTER — Encounter (HOSPITAL_BASED_OUTPATIENT_CLINIC_OR_DEPARTMENT_OTHER): Payer: Self-pay | Admitting: *Deleted

## 2013-10-12 ENCOUNTER — Other Ambulatory Visit (INDEPENDENT_AMBULATORY_CARE_PROVIDER_SITE_OTHER): Payer: Self-pay | Admitting: General Surgery

## 2013-10-16 ENCOUNTER — Ambulatory Visit (HOSPITAL_BASED_OUTPATIENT_CLINIC_OR_DEPARTMENT_OTHER)
Admission: RE | Admit: 2013-10-16 | Discharge: 2013-10-16 | Disposition: A | Discharge status: Home / Self Care | Payer: BC Managed Care – PPO | Source: Ambulatory Visit | Attending: General Surgery | Admitting: General Surgery

## 2013-10-16 ENCOUNTER — Ambulatory Visit (HOSPITAL_BASED_OUTPATIENT_CLINIC_OR_DEPARTMENT_OTHER): Payer: BC Managed Care – PPO | Admitting: Anesthesiology

## 2013-10-16 ENCOUNTER — Encounter (HOSPITAL_BASED_OUTPATIENT_CLINIC_OR_DEPARTMENT_OTHER)
Admission: RE | Disposition: A | Discharge status: Home / Self Care | Payer: Self-pay | Source: Ambulatory Visit | Attending: General Surgery

## 2013-10-16 ENCOUNTER — Encounter (HOSPITAL_BASED_OUTPATIENT_CLINIC_OR_DEPARTMENT_OTHER): Payer: BC Managed Care – PPO | Admitting: Anesthesiology

## 2013-10-16 ENCOUNTER — Encounter (HOSPITAL_BASED_OUTPATIENT_CLINIC_OR_DEPARTMENT_OTHER): Payer: Self-pay | Admitting: *Deleted

## 2013-10-16 DIAGNOSIS — K640 First degree hemorrhoids: Secondary | ICD-10-CM | POA: Insufficient documentation

## 2013-10-16 DIAGNOSIS — G4730 Sleep apnea, unspecified: Secondary | ICD-10-CM | POA: Diagnosis not present

## 2013-10-16 DIAGNOSIS — J45909 Unspecified asthma, uncomplicated: Secondary | ICD-10-CM | POA: Diagnosis not present

## 2013-10-16 DIAGNOSIS — F419 Anxiety disorder, unspecified: Secondary | ICD-10-CM | POA: Insufficient documentation

## 2013-10-16 DIAGNOSIS — Z79899 Other long term (current) drug therapy: Secondary | ICD-10-CM | POA: Diagnosis not present

## 2013-10-16 DIAGNOSIS — Z882 Allergy status to sulfonamides status: Secondary | ICD-10-CM | POA: Insufficient documentation

## 2013-10-16 DIAGNOSIS — K644 Residual hemorrhoidal skin tags: Secondary | ICD-10-CM | POA: Diagnosis present

## 2013-10-16 DIAGNOSIS — K648 Other hemorrhoids: Secondary | ICD-10-CM | POA: Diagnosis present

## 2013-10-16 DIAGNOSIS — Z87891 Personal history of nicotine dependence: Secondary | ICD-10-CM | POA: Insufficient documentation

## 2013-10-16 HISTORY — PX: HEMORRHOID SURGERY: SHX153

## 2013-10-16 HISTORY — DX: Unspecified asthma, uncomplicated: J45.909

## 2013-10-16 LAB — POCT HEMOGLOBIN-HEMACUE: Hemoglobin: 13.6 g/dL (ref 13.0–17.0)

## 2013-10-16 SURGERY — HEMORRHOIDECTOMY
Anesthesia: General | Site: Rectum

## 2013-10-16 MED ORDER — OXYCODONE HCL 5 MG PO TABS: 5.0000 mg | ORAL_TABLET | Freq: Once | ORAL | Status: DC | PRN

## 2013-10-16 MED ORDER — SUCCINYLCHOLINE CHLORIDE 20 MG/ML IJ SOLN
INTRAMUSCULAR | Status: AC
  Filled 2013-10-16: qty 1

## 2013-10-16 MED ORDER — BUPIVACAINE LIPOSOME 1.3 % IJ SUSP
INTRAMUSCULAR | Status: DC | PRN
  Administered 2013-10-16: 20 mL

## 2013-10-16 MED ORDER — BUPIVACAINE LIPOSOME 1.3 % IJ SUSP
INTRAMUSCULAR | Status: AC
  Filled 2013-10-16: qty 20

## 2013-10-16 MED ORDER — FENTANYL CITRATE 0.05 MG/ML IJ SOLN: 50.0000 ug | INTRAMUSCULAR | Status: DC | PRN

## 2013-10-16 MED ORDER — DEXAMETHASONE SODIUM PHOSPHATE 4 MG/ML IJ SOLN
INTRAMUSCULAR | Status: DC | PRN
  Administered 2013-10-16: 10 mg via INTRAVENOUS

## 2013-10-16 MED ORDER — FENTANYL CITRATE 0.05 MG/ML IJ SOLN
INTRAMUSCULAR | Status: AC
  Filled 2013-10-16: qty 6

## 2013-10-16 MED ORDER — MIDAZOLAM HCL 5 MG/5ML IJ SOLN
INTRAMUSCULAR | Status: DC | PRN
  Administered 2013-10-16: 2 mg via INTRAVENOUS

## 2013-10-16 MED ORDER — DIBUCAINE 1 % RE OINT
TOPICAL_OINTMENT | RECTAL | Status: DC | PRN
  Administered 2013-10-16: 1 via RECTAL

## 2013-10-16 MED ORDER — OXYCODONE HCL 5 MG/5ML PO SOLN: 5.0000 mg | Freq: Once | ORAL | Status: DC | PRN

## 2013-10-16 MED ORDER — FENTANYL CITRATE 0.05 MG/ML IJ SOLN
INTRAMUSCULAR | Status: DC | PRN
  Administered 2013-10-16: 25 ug via INTRAVENOUS
  Administered 2013-10-16: 100 ug via INTRAVENOUS
  Administered 2013-10-16: 25 ug via INTRAVENOUS

## 2013-10-16 MED ORDER — MIDAZOLAM HCL 2 MG/2ML IJ SOLN
INTRAMUSCULAR | Status: AC
  Filled 2013-10-16: qty 2

## 2013-10-16 MED ORDER — LACTATED RINGERS IV SOLN
INTRAVENOUS | Status: DC
  Administered 2013-10-16 (×2): via INTRAVENOUS

## 2013-10-16 MED ORDER — PROMETHAZINE HCL 25 MG/ML IJ SOLN: 6.2500 mg | INTRAMUSCULAR | Status: DC | PRN

## 2013-10-16 MED ORDER — OXYCODONE HCL 5 MG PO TABS: 5.0000 mg | ORAL_TABLET | ORAL | Status: DC | PRN

## 2013-10-16 MED ORDER — KETOROLAC TROMETHAMINE 15 MG/ML IJ SOLN
15.0000 mg | Freq: Four times a day (QID) | INTRAMUSCULAR | Status: DC
Start: 2013-10-16 — End: 2013-10-16

## 2013-10-16 MED ORDER — ACETAMINOPHEN 325 MG PO TABS: 650.0000 mg | ORAL_TABLET | ORAL | Status: DC | PRN

## 2013-10-16 MED ORDER — SODIUM CHLORIDE 0.9 % IJ SOLN: 3.0000 mL | Freq: Two times a day (BID) | INTRAMUSCULAR | Status: DC

## 2013-10-16 MED ORDER — BUPIVACAINE-EPINEPHRINE (PF) 0.25% -1:200000 IJ SOLN
INTRAMUSCULAR | Status: AC
  Filled 2013-10-16: qty 30

## 2013-10-16 MED ORDER — ONDANSETRON HCL 4 MG/2ML IJ SOLN
INTRAMUSCULAR | Status: DC | PRN
  Administered 2013-10-16: 4 mg via INTRAVENOUS

## 2013-10-16 MED ORDER — DIBUCAINE 1 % RE OINT
TOPICAL_OINTMENT | RECTAL | Status: AC
  Filled 2013-10-16: qty 28

## 2013-10-16 MED ORDER — ACETAMINOPHEN 650 MG RE SUPP: 650.0000 mg | RECTAL | Status: DC | PRN

## 2013-10-16 MED ORDER — SODIUM CHLORIDE 0.9 % IJ SOLN: 3.0000 mL | INTRAMUSCULAR | Status: DC | PRN

## 2013-10-16 MED ORDER — PROPOFOL 10 MG/ML IV EMUL
INTRAVENOUS | Status: AC
  Filled 2013-10-16: qty 50

## 2013-10-16 MED ORDER — SODIUM CHLORIDE 0.9 % IV SOLN: 250.0000 mL | INTRAVENOUS | Status: DC | PRN

## 2013-10-16 MED ORDER — BUPIVACAINE-EPINEPHRINE 0.25% -1:200000 IJ SOLN
INTRAMUSCULAR | Status: DC | PRN
  Administered 2013-10-16: 1 mL

## 2013-10-16 MED ORDER — SUCCINYLCHOLINE CHLORIDE 20 MG/ML IJ SOLN
INTRAMUSCULAR | Status: DC | PRN
  Administered 2013-10-16: 100 mg via INTRAVENOUS

## 2013-10-16 MED ORDER — HYDROMORPHONE HCL 1 MG/ML IJ SOLN: 0.2500 mg | INTRAMUSCULAR | Status: DC | PRN

## 2013-10-16 MED ORDER — MIDAZOLAM HCL 2 MG/2ML IJ SOLN: 1.0000 mg | INTRAMUSCULAR | Status: DC | PRN

## 2013-10-16 MED ORDER — PROPOFOL 10 MG/ML IV BOLUS
INTRAVENOUS | Status: DC | PRN
  Administered 2013-10-16: 200 mg via INTRAVENOUS

## 2013-10-16 SURGICAL SUPPLY — 45 items
BENZOIN TINCTURE PRP APPL 2/3 (GAUZE/BANDAGES/DRESSINGS) ×2 IMPLANT
BLADE SURG 11 STRL SS (BLADE) IMPLANT
BLADE SURG 15 STRL LF DISP TIS (BLADE) ×1 IMPLANT
BLADE SURG 15 STRL SS (BLADE) ×1
BRIEF STRETCH FOR OB PAD LRG (UNDERPADS AND DIAPERS) ×2 IMPLANT
CANISTER SUCTION 1200CC (MISCELLANEOUS) ×2 IMPLANT
COVER BACK TABLE 60X90IN (DRAPES) ×2 IMPLANT
COVER MAYO STAND STRL (DRAPES) ×2 IMPLANT
DECANTER SPIKE VIAL GLASS SM (MISCELLANEOUS) IMPLANT
DRAPE PED LAPAROTOMY (DRAPES) ×2 IMPLANT
DRAPE UTILITY XL STRL (DRAPES) ×2 IMPLANT
DRSG PAD ABDOMINAL 8X10 ST (GAUZE/BANDAGES/DRESSINGS) ×2 IMPLANT
ELECT COATED BLADE 2.86 ST (ELECTRODE) ×2 IMPLANT
ELECT REM PT RETURN 9FT ADLT (ELECTROSURGICAL) ×2
ELECTRODE REM PT RTRN 9FT ADLT (ELECTROSURGICAL) ×1 IMPLANT
GAUZE SPONGE 4X4 12PLY STRL (GAUZE/BANDAGES/DRESSINGS) ×2 IMPLANT
GAUZE VASELINE 1X8 (GAUZE/BANDAGES/DRESSINGS) IMPLANT
GLOVE BIO SURGEON STRL SZ7.5 (GLOVE) ×2 IMPLANT
GLOVE BIOGEL PI IND STRL 7.0 (GLOVE) ×1 IMPLANT
GLOVE BIOGEL PI IND STRL 8 (GLOVE) ×1 IMPLANT
GLOVE BIOGEL PI INDICATOR 7.0 (GLOVE) ×1
GLOVE BIOGEL PI INDICATOR 8 (GLOVE) ×1
GLOVE ECLIPSE 6.5 STRL STRAW (GLOVE) ×2 IMPLANT
GLOVE EXAM NITRILE LRG STRL (GLOVE) ×2 IMPLANT
GOWN STRL REUS W/ TWL LRG LVL3 (GOWN DISPOSABLE) ×1 IMPLANT
GOWN STRL REUS W/ TWL XL LVL3 (GOWN DISPOSABLE) ×1 IMPLANT
GOWN STRL REUS W/TWL LRG LVL3 (GOWN DISPOSABLE) ×1
GOWN STRL REUS W/TWL XL LVL3 (GOWN DISPOSABLE) ×1
NEEDLE HYPO 25X1 1.5 SAFETY (NEEDLE) ×2 IMPLANT
PACK BASIN DAY SURGERY FS (CUSTOM PROCEDURE TRAY) ×2 IMPLANT
PENCIL BUTTON HOLSTER BLD 10FT (ELECTRODE) ×2 IMPLANT
SHEARS HARMONIC 9CM CVD (BLADE) ×2 IMPLANT
SLEEVE SCD COMPRESS KNEE MED (MISCELLANEOUS) ×2 IMPLANT
SPONGE GAUZE 4X4 12PLY STER LF (GAUZE/BANDAGES/DRESSINGS) ×2 IMPLANT
SPONGE SURGIFOAM ABS GEL 100 (HEMOSTASIS) ×2 IMPLANT
SURGILUBE 2OZ TUBE FLIPTOP (MISCELLANEOUS) ×2 IMPLANT
SUT CHROMIC 3 0 SH 27 (SUTURE) ×2 IMPLANT
SYR CONTROL 10ML LL (SYRINGE) ×4 IMPLANT
TAPE CLOTH 3X10 TAN LF (GAUZE/BANDAGES/DRESSINGS) ×2 IMPLANT
TOWEL OR 17X24 6PK STRL BLUE (TOWEL DISPOSABLE) ×4 IMPLANT
TOWEL OR NON WOVEN STRL DISP B (DISPOSABLE) IMPLANT
TRAY DSU PREP LF (CUSTOM PROCEDURE TRAY) ×2 IMPLANT
TUBE CONNECTING 20X1/4 (TUBING) ×2 IMPLANT
UNDERPAD 30X30 INCONTINENT (UNDERPADS AND DIAPERS) IMPLANT
YANKAUER SUCT BULB TIP NO VENT (SUCTIONS) ×2 IMPLANT

## 2013-10-16 NOTE — Anesthesia Postprocedure Evaluation (Signed)
  Anesthesia Post-op Note  Patient: Richard Reyes  Procedure(s) Performed: Procedure(s): EXCISION OF EXTERNAL HEMORRHOIDS X 2 AND ANAL EXAM UNDER ANESTHESIA (N/A)  Patient Location: PACU  Anesthesia Type: General   Level of Consciousness: awake, alert  and oriented  Airway and Oxygen Therapy: Patient Spontanous Breathing  Post-op Pain: mild  Post-op Assessment: Post-op Vital signs reviewed  Post-op Vital Signs: Reviewed  Last Vitals:  Filed Vitals:   10/16/13 0938  BP: 124/78  Pulse: 71  Temp: 36.6 C  Resp: 18    Complications: No apparent anesthesia complications

## 2013-10-16 NOTE — Anesthesia Preprocedure Evaluation (Addendum)
Anesthesia Evaluation  Patient identified by MRN, date of birth, ID band Patient awake    Reviewed: Allergy & Precautions, H&P , NPO status   Airway Mallampati: II  Neck ROM: Full    Dental  (+) Teeth Intact   Pulmonary asthma , sleep apnea , former smoker,  breath sounds clear to auscultation Very mild wheeze        Cardiovascular Rhythm:Regular Rate:Normal     Neuro/Psych    GI/Hepatic   Endo/Other  Morbid obesity  Renal/GU      Musculoskeletal   Abdominal (+) + obese,   Peds  Hematology   Anesthesia Other Findings   Reproductive/Obstetrics                          Anesthesia Physical Anesthesia Plan  ASA: II  Anesthesia Plan: General   Post-op Pain Management:    Induction: Intravenous  Airway Management Planned: Oral ETT  Additional Equipment:   Intra-op Plan:   Post-operative Plan: Extubation in OR  Informed Consent: I have reviewed the patients History and Physical, chart, labs and discussed the procedure including the risks, benefits and alternatives for the proposed anesthesia with the patient or authorized representative who has indicated his/her understanding and acceptance.   Dental advisory given  Plan Discussed with: CRNA and Surgeon  Anesthesia Plan Comments:         Anesthesia Quick Evaluation

## 2013-10-16 NOTE — Discharge Instructions (Signed)
[facility]  RECTAL SURGERY POST OP INSTRUCTIONS: POST OP INSTRUCTIONS  Always review your discharge instruction sheet given to you by the facility where your surgery was performed. IF YOU HAVE DISABILITY OR FAMILY LEAVE FORMS, YOU MUST BRING THEM TO THE OFFICE FOR PROCESSING.   DO NOT GIVE THEM TO YOUR DOCTOR.  1. A  prescription for pain medication may be given to you upon discharge.  Take your pain medication as prescribed, if needed.  If narcotic pain medicine is not needed, then you may take acetaminophen (Tylenol) or ibuprofen (Advil) as needed. 2. Take your usually prescribed medications unless otherwise directed. 3. If you need a refill on your pain medication, please contact your pharmacy.  They will contact our office to request authorization. Prescriptions will not be filled after 5 pm or on week-ends. 4. You should follow a light diet the first 48 hours after arrival home, such as soup and crackers, etc.  Be sure to include lots of fluids daily.  Resume your normal diet 2-3 days after surgery.. 5. Most patients will experience some swelling and discomfort in the rectal area. Ice packs, reclining and warm tub soaks will help.  Swelling and discomfort can take several days to resolve.  6. Soak in warm water tub 3-4 times a day for 20 minutes at a time and after a bowel movement 7. It is common to experience some constipation if taking pain medication after surgery.  Increasing fluid intake and taking a stool softener (such as Colace) will usually help or prevent this problem from occurring.  Start taking Miralax daily - follow package directions - stop taking if develop diarrhea. 8. Unless discharge instructions indicate otherwise, leave your bandage dry and in place for 24 hours, or remove the bandage if you have a bowel movement. You may notice a small amount of bleeding with bowel movements for the first few days. You  have some packing in the rectum which will  come out over the first day or two. You will need to wear an absorbent pad or soft cotton gauze in your underwear until the drainage stops.it. 9. ACTIVITIES:  You may resume regular (light) daily activities beginning the next day--such as daily self-care, walking, climbing stairs--gradually increasing activities as tolerated.  You may have sexual intercourse when it is comfortable.  Refrain from any heavy lifting or straining until approved by your doctor. a. You may drive when you are no longer taking prescription pain medication, you can comfortably wear a seatbelt, and you can safely maneuver your car and apply brakes. b. RETURN TO WORK: : ____________________ c.  10. You should see your doctor in the office for a follow-up appointment approximately 2-3 weeks after your surgery.  Make sure that you call for this appointment within a day or two after you arrive home to insure a convenient appointment time. 11. OTHER INSTRUCTIONS:   WHEN TO CALL YOUR DOCTOR: 1. Fever over 101.0 2. Inability to urinate 3. Nausea and/or vomiting 4. Extreme swelling or bruising 5. Continued bleeding from rectum. 6. Increased pain, redness, or drainage from the incision 7. Constipation  The clinic staff is available to answer your questions during regular business hours.  Please dont hesitate to call and ask to speak to one of the nurses for clinical concerns.  If you have a medical emergency, go to the nearest emergency room or call 911.  A surgeon from [facility] is always on call at the hospital  [address] [place], [place], [place]  [address] ?  [address], [place], [place]   [address] [phone] ? [phone] ? FAX [phone] Web site: [email or web address]   Post Anesthesia Home Care Instructions  Activity: Get plenty of rest for the remainder of the day. A responsible adult should stay with you for 24 hours following the procedure.  For the next 24 hours,  DO NOT: -Drive a car -Paediatric nurse -Drink alcoholic beverages -Take any medication unless instructed by your physician -Make any legal decisions or sign important papers.  Meals: Start with liquid foods such as gelatin or soup. Progress to regular foods as tolerated. Avoid greasy, spicy, heavy foods. If nausea and/or vomiting occur, drink only clear liquids until the nausea and/or vomiting subsides. Call your physician if vomiting continues.  Special Instructions/Symptoms: Your throat may feel dry or sore from the anesthesia or the breathing tube placed in your throat during surgery. If this causes discomfort, gargle with warm salt water. The discomfort should disappear within 24 hours.   Information for Discharge Teaching: EXPAREL (bupivacaine liposome injectable suspension)   Your surgeon gave you EXPAREL(bupivacaine) in your surgical incision to help control your pain after surgery.   EXPAREL is a local anesthetic that provides pain relief by numbing the tissue around the surgical site.  EXPAREL is designed to release pain medication over time and can control pain for up to 72 hours.  Depending on how you respond to EXPAREL, you may require less pain medication during your recovery.  Possible side effects:  Temporary loss of sensation or ability to move in the area where bupivacaine was injected.  Nausea, vomiting, constipation  Rarely, numbness and tingling in your mouth or lips, lightheadedness, or anxiety may occur.  Call your doctor right away if you think you may be experiencing any of these sensations, or if you have other questions regarding possible side effects.  Follow all other discharge instructions given to you by your surgeon or nurse. Eat a healthy diet and drink plenty of water or other fluids.  If you return to the hospital for any reason within 96 hours following the administration of EXPAREL, please inform your health care providers.

## 2013-10-16 NOTE — Transfer of Care (Signed)
Immediate Anesthesia Transfer of Care Note  Patient: Richard Reyes  Procedure(s) Performed: Procedure(s): EXCISION OF EXTERNAL HEMORRHOIDS X 2 AND ANAL EXAM UNDER ANESTHESIA (N/A)  Patient Location: PACU  Anesthesia Type:General  Level of Consciousness: awake, alert  and oriented  Airway & Oxygen Therapy: Patient Spontanous Breathing and Patient connected to face mask oxygen  Post-op Assessment: Report given to PACU RN and Post -op Vital signs reviewed and stable  Post vital signs: Reviewed and stable  Complications: No apparent anesthesia complications

## 2013-10-16 NOTE — Op Note (Signed)
10/16/2013  8:29 AM  PATIENT:  Richard Reyes  48 y.o. male  PRE-OPERATIVE DIAGNOSIS:  external and internal hemorrhoids  POST-OPERATIVE DIAGNOSIS:  Left and right posterolateral external hemorrhoids; Grade 1 (first degree) right internal hemorrhoid  PROCEDURE:  Procedure(s): EXCISION OF EXTERNAL HEMORRHOIDS X 2 AND ANAL EXAM UNDER ANESTHESIA  SURGEON:  Surgeon(s): Gayland Curry, MD  ASSISTANTS: none   ANESTHESIA:   local and general  DRAINS: none   LOCAL MEDICATIONS USED:  OTHER Exparel  SPECIMEN:  Source of Specimen:  ext hemorrhoid x2  DISPOSITION OF SPECIMEN:  discarded  COUNTS:  YES  INDICATION FOR PROCEDURE: 48 year old Caucasian male who presented with ongoing symptomatic external hemorrhoids. Her the past couple months we have improved his bowel habits and he no longer has constipation. However he still has symptomatic redundant external hemorrhoidal tissue that will occasionally swell and become painful and uncomfortable. He desired surgical excision. We discussed the risks and benefits of surgery. Please see my separate note regarding that consultation  PROCEDURE: After obtaining informed consent, the patient was taken back to the operating room at [facility]. Endotracheal anesthesia was established. He was in place in the prone jackknife position with the appropriate padding. Sequential compression devices were placed. His buttocks were taped apart. His buttocks and perineum were prepped and draped in the usual standard surgical fashion with Betadine. A surgical timeout was performed. On visual inspection he had redundant almost pedunculated left posterior lateral nonthrombosed external hemorrhoidal tissue. In the right posterior lateral position he had redundant non-thrombosed external hemorrhoidal tissue. Anoscopy was performed in circumferential manner which revealed no significant internal hemorrhoidal disease burden. He had a small grade 1 right  lateral internal hemorrhoid which I decided to leave alone. A small amount of quarter percent Marcaine was infiltrated at the base of the left lateral external hemorrhoidal tissue. I then made a V-shaped incision with a #15 blade. Then lifting the hemorrhoidal tissue away from the muscular sphincter I used the harmonic scalpel to excise it in its entirety. I then reapproximated the mucosa with a running 3-0 chromic suture leaving a small opening out laterally for drainage. I then turned my attention to the right posterior lateral redundant nonthrombosed external hemorrhoid. It was quite close the left-sided one. I infiltrated local at the base of this hemorrhoid as well. Again made a similar V-shaped incision with #15 blade. Then using a fine mosquito I lifted the hemorrhoid away from the underlying sphincter and excised it in its entirety with harmonic scalpel. There was excellent hemostasis. I elected to leave this site open as I felt if I tried to close this side given its proximity to the left posterior-lateral hemorrhoid that was excised the suture would not hold. I then infiltrated 20 cc of Hexadrol in a regional fashion. A piece of Gelfoam was placed in the rectal vault. 4 x 4's fluffs and mesh underwear were applied. The patient was then placed in the supine position and extubated and taken to the recovery room in stable condition. All needle, instrument, and sponge counts were correct x2. There were no immediate complications.  PLAN OF CARE: Discharge to home after PACU  PATIENT DISPOSITION:  PACU - hemodynamically stable.   Delay start of Pharmacological VTE agent (>24hrs) due to surgical blood loss or risk of bleeding:  no  Leighton Ruff. Redmond Pulling, MD, FACS General, Bariatric, & Minimally Invasive Surgery [facility], [place]

## 2013-10-16 NOTE — Anesthesia Procedure Notes (Signed)
Procedure Name: Intubation Date/Time: 10/16/2013 7:46 AM Performed by: Melynda Ripple D Pre-anesthesia Checklist: Patient identified, Emergency Drugs available, Suction available and Patient being monitored Patient Re-evaluated:Patient Re-evaluated prior to inductionOxygen Delivery Method: Circle System Utilized Preoxygenation: Pre-oxygenation with 100% oxygen Intubation Type: IV induction Ventilation: Mask ventilation without difficulty Laryngoscope Size: Mac and 3 Grade View: Grade I Tube type: Oral Tube size: 7.0 mm Number of attempts: 1 Airway Equipment and Method: stylet and oral airway Placement Confirmation: ETT inserted through vocal cords under direct vision,  positive ETCO2 and breath sounds checked- equal and bilateral Secured at: 23 cm Tube secured with: Tape Dental Injury: Teeth and Oropharynx as per pre-operative assessment

## 2013-10-16 NOTE — H&P (Addendum)
History of Present Illness Randall Hiss M. Meris Reede MD; 10/07/2013 8:28 AM) Patient words: Discuss having hemorrhoid sx Pt was here in July and never heard back from our office about scheduling sx Pt needs sx scheduled ASAP b/c his health insurance changes in the next month.  The patient is a 48 year old male who presents with hemorrhoids. 48 yo WM comes in for additional follow up regarding his hemorrhoids. I last saw him on 7/22. he reports daily BMs. he denies any melena, hematochezia, or blood per rectum. He did have a case of proctitis and was treated with cipro and flomax. he denies any dysuria or frequency. He started taking fiber and noticed his BMs were more bulky -"less wipe, more clear". He drinks lot of water daily. He is still interested in hemorrhoid surgery   Other Problems Lars Mage [place], [place]; 09/28/2013 3:36 PM) Anxiety Disorder Back Pain Bladder Problems Hemorrhoids Sleep Apnea  Past Surgical History Illene Regulus, MA; 09/28/2013 3:36 PM) No pertinent past surgical history  Diagnostic Studies History Lars Mage [place], [place]; 09/28/2013 3:36 PM) Colonoscopy never  Allergies Lars Mage [place], [place]; 09/28/2013 3:40 PM) Sulfabenzamide *CHEMICALS*  Medication History (Alisha Spillers, MA; 09/28/2013 3:44 PM) ClonazePAM (0.5MG  Tablet, Oral as needed) Active. Ciprofloxacin HCl (500MG  Tablet, Oral daily) Active. Ketoconazole (2% Cream, External as needed) Active. Ketoconazole (2% Shampoo, External as needed) Active. MetroNIDAZOLE (1% Gel, External as needed) Active. Tamsulosin HCl (0.4MG  Capsule, Oral as needed) Active. Amitriptyline HCl (50MG  Tablet, Oral daily) Active. ProAir HFA (108 (90 Base)MCG/ACT Aerosol Soln, Inhalation as needed) Active. Medications Reconciled  Social History Lars Mage [place], [place]; 09/28/2013 3:36 PM) Caffeine use Tea. No drug use Tobacco use Former smoker.  Family History Lars Mage [place], [place]; 09/28/2013 3:36 PM) Respiratory Condition  Father.  Review of Systems (Lars Mage [place]; 09/28/2013 3:36 PM) General Not Present- Appetite Loss, Chills, Fatigue, Fever, Night Sweats, Weight Gain and Weight Loss. Skin Present- Dryness. Not Present- Change in Wart/Mole, Hives, Jaundice, New Lesions, Non-Healing Wounds, Rash and Ulcer. HEENT Present- Wears glasses/contact lenses. Not Present- Earache, Hearing Loss, Hoarseness, Nose Bleed, Oral Ulcers, Ringing in the Ears, Seasonal Allergies, Sinus Pain, Sore Throat, Visual Disturbances and Yellow Eyes. Respiratory Present- Snoring. Not Present- Bloody sputum, Chronic Cough, Difficulty Breathing and Wheezing. Cardiovascular Not Present- Chest Pain, Difficulty Breathing Lying Down, Leg Cramps, Palpitations, Rapid Heart Rate, Shortness of Breath and Swelling of Extremities. Gastrointestinal Present- Gets full quickly at meals and Hemorrhoids. Not Present- Abdominal Pain, Bloating, Bloody Stool, Change in Bowel Habits, Chronic diarrhea, Constipation, Difficulty Swallowing, Excessive gas, Indigestion, Nausea, Rectal Pain and Vomiting. Male Genitourinary Not Present- Blood in Urine, Change in Urinary Stream, Frequency, Impotence, Nocturia, Painful Urination, Urgency and Urine Leakage. Musculoskeletal Not Present- Back Pain, Joint Pain, Joint Stiffness, Muscle Pain, Muscle Weakness and Swelling of Extremities. Neurological Not Present- Decreased Memory, Fainting, Headaches, Numbness, Seizures, Tingling, Tremor, Trouble walking and Weakness. Psychiatric Not Present- Anxiety, Bipolar, Change in Sleep Pattern, Depression, Fearful and Frequent crying. Endocrine Not Present- Cold Intolerance, Excessive Hunger, Hair Changes, Heat Intolerance, Hot flashes and New Diabetes. Hematology Not Present- Easy Bruising, Excessive bleeding, Gland problems, HIV and Persistent Infections.   Vitals (Alisha Spillers MA; 09/28/2013 3:39 PM) 09/28/2013 3:37 PM Weight: 210 lb Height: 69.5in Body Surface Area: 2.16  m Body Mass Index: 30.57 kg/m BP: 110/70 (Sitting, Left Arm, Standard)    Physical Exam Randall Hiss M. Alexzandrea Normington MD; 10/07/2013 8:32 AM) General Mental Status-Alert. General Appearance-Consistent with stated age. Hydration-Well hydrated. Voice-Normal.  Chest and Lung Exam Chest and lung exam reveals -  quiet, even and easy respiratory effort with no use of accessory muscles and on auscultation, normal breath sounds, no adventitious sounds and normal vocal resonance. Inspection Chest Wall - Normal. Back - normal.  Cardiovascular Cardiovascular examination reveals -normal heart sounds, regular rate and rhythm with no murmurs, carotid auscultation reveals no bruits and normal pedal pulses bilaterally.  Abdomen Inspection Skin - Scar - no surgical scars. Palpation/Percussion Palpation and Percussion of the abdomen reveal - Soft, Non Tender, No Rebound tenderness, No Rigidity (guarding) and No hepatosplenomegaly. Auscultation Auscultation of the abdomen reveals - Bowel sounds normal.  Rectal Note: REDUNDANT NON-THROMBOSED EXT HEM TISSUE IN LEFT AND RIGHT POSTERIOR LATERAL POSITION. GOOD TONE. RIGHT ANT GRADE 1 INT HEMORRHOID   Neurologic Mental Status Speech - Normal.  Musculoskeletal Normal Exam - Bilateral-Upper Extremity Strength Normal and Lower Extremity Strength Normal.    Results Randall Hiss M. Sherlie Boyum MD; 10/07/2013 8:42 AM) Procedures  Name Value Date Hemorrhoids Procedure Anal exam: External Hemorrhoid (x2) Internal exam: Internal Hemorroids ( non-bleeding) Other: see physical exam sectino for further details  Performed: 09/28/2013 8:37 AM   Assessment & Plan Randall Hiss M. Samie Reasons MD; 10/07/2013 8:42 AM) EXTERNAL HEMORRHOID (K64.4) Impression: He does have some redundant ext hemorrhoidal tissue that will occasionally swell and cause problems despite improving his bowel regimen. He has demonstrated compliance with water, fiber supplementation, bathroom  habits. Therefore, I don't think it is unreasonable to offer him Exam under anethesia, excision of external hemorrhoids. We have previously discussed risk benefits of surgery but re-discussed today.  We discussed the etiology of hemorrhoids. The patient was given educational material as well as diagrams. We discussed nonoperative and operative management of hemorrhoidal disease.  We then discussed different surgical techniques for hemorrhoids, specifically hemorrhoidal banding and excisional hemorrhoidectomy.  I discussed the procedure in detail. The patient was given Neurosurgeon. We discussed the risks and benefits of surgery including, but not limited to bleeding, infection, blood clot formation, anesthesia risk, urinary retention, hemorrhoid recurrence, injury to the sphincters resulting in incontinence, and the rare possibility of anal canal narrowing. I explained that the likelihood of improvement of their symptoms is good  We discussed the typical postoperative course. I stressed the importance of not becoming constipated after surgery. The patient was encouraged to limit pain medication if possible as this increases the likelihood of becoming constipated. The patient was advised to take stool softners & drink 8-10 glasses of non-carbonated, non-alcoholic beverages per day and to eat a high fiber diet. I also encouraged soaking in a water warm bath for 15 minutes at a time several times a day and after a bowel movement. The patient was advised to take laxatives such as milk of magnesia or Miralax if no bowel movement three days after surgery Current Plans  Schedule for Surgery - Exam under anesthesia, excision of external hemorrhoids, possible banding Started Flomax 0.4MG , 1 (one) Capsule daily, #7, 7 days starting 09/28/2013, No Refill. Pt Education - CCS Hemorrhoids (Gross) FIRST DEGREE HEMORRHOIDS (K64.0) Impression: Will plan to possible band this during during surgery Current  Plans ANOSCOPY, DIAGNOSTIC ([id number])  Leighton Ruff. Redmond Pulling, MD, FACS General, Bariatric, & Minimally Invasive Surgery [facility], [place]

## 2013-10-17 ENCOUNTER — Encounter (HOSPITAL_BASED_OUTPATIENT_CLINIC_OR_DEPARTMENT_OTHER): Payer: Self-pay | Admitting: General Surgery

## 2013-11-08 DIAGNOSIS — K649 Unspecified hemorrhoids: Secondary | ICD-10-CM | POA: Insufficient documentation

## 2013-11-27 ENCOUNTER — Encounter: Payer: Self-pay | Admitting: Internal Medicine

## 2014-01-17 ENCOUNTER — Encounter: Payer: Self-pay | Admitting: *Deleted

## 2014-01-17 DIAGNOSIS — Z8719 Personal history of other diseases of the digestive system: Secondary | ICD-10-CM | POA: Insufficient documentation

## 2014-01-17 DIAGNOSIS — Z9889 Other specified postprocedural states: Principal | ICD-10-CM

## 2014-01-18 ENCOUNTER — Ambulatory Visit: Payer: BC Managed Care – PPO | Admitting: Adult Health

## 2014-01-19 ENCOUNTER — Ambulatory Visit: Payer: BC Managed Care – PPO | Admitting: Adult Health

## 2014-01-31 ENCOUNTER — Encounter: Payer: Self-pay | Admitting: *Deleted

## 2014-03-22 ENCOUNTER — Encounter: Payer: Self-pay | Admitting: Adult Health

## 2014-03-22 ENCOUNTER — Ambulatory Visit (INDEPENDENT_AMBULATORY_CARE_PROVIDER_SITE_OTHER): Payer: PRIVATE HEALTH INSURANCE | Admitting: Adult Health

## 2014-03-22 VITALS — BP 116/77 | HR 91 | Ht 69.5 in | Wt 222.0 lb

## 2014-03-22 DIAGNOSIS — G4700 Insomnia, unspecified: Secondary | ICD-10-CM

## 2014-03-22 DIAGNOSIS — G4733 Obstructive sleep apnea (adult) (pediatric): Secondary | ICD-10-CM

## 2014-03-22 DIAGNOSIS — Z9989 Dependence on other enabling machines and devices: Secondary | ICD-10-CM

## 2014-03-22 MED ORDER — SUVOREXANT 5 MG PO TABS: 5.0000 mg | ORAL_TABLET | Freq: Every evening | ORAL | Status: DC | PRN

## 2014-03-22 NOTE — Progress Notes (Signed)
I agree with the assessment and plan as directed by NP .The patient is known to me .   Nicoli Nardozzi, MD  

## 2014-03-22 NOTE — Progress Notes (Signed)
PATIENT: Richard Reyes DOB: 1965-12-18  REASON FOR VISIT: follow up- OSA on CPAP HISTORY FROM: patient  HISTORY OF PRESENT ILLNESS: Richard Reyes is a 49 year old male with history of obstructive sleep Apnea. Marland Kitchen He returns today for a 30 day compliance download. He brought his machine with him today and his reports shows an AHI of 5.0 at 8 cm of water, uses his machine for 3 hours and 42 minutes a night, with 47% compliance. He uses his machine greater than 4 hours only for 30 days with a compliance of 13%. Patient states that he has not been using his machine because he has been sleeping on the couch. He states that he feels like he sleeps better on the couch. His Epworth score is 3 points was previously 8 points. His fatigue severity score is 24 was previously 33.Patient reports that he gets about 8 hours of sleep a night. He goes to bed around 10:30PM and arises at 6:30AM. He continues to have a have a hard time staying asleep. He states that he will wake up multiple times during the night. He has tried Quarry manager, Ambien and Lunesta in the past without benefit. States that the gets up about 2-3 times a night to urinate. .Since the last visit the patient has had no new medical issues.   History 07/17/13 ([place]):  Richard Reyes returned for a split night polysomnography on 06-02-13 he had endorsed the Epworth score at 12 points and sleepy HQ form for depression and 8 points. The patient's AHI was 47.3 and RDI 51.2. The patient had no PLMs of sleep . Average heart rate remained in normal sinus rhythm 67 beats per minute.  He was titrated to 8 cm water with a reduction of the AHI to 1.6. Supine AHI was still around 9 versus nonsupine AHI of 1.2 .  I have new compliance report available today dated 7-12/15 showing 100% compliance for the patient average time of use of 7hours and 4 minutes at night 8 cm water pressure this 1 cm EPI or residual AHI is 2.3. The patient is a mouth breather but has a nasal  pillow as well as a fullface mask available and uses a nasal spray.  His Epworth sleepiness score today is 8 points from previously 12 his fatigue severity score of 33 points .   He is still complaining about insomnia, but less than prior to CPAP use, he is more alert, no nocturia. He tends to wake up early. I suggested a trial of SONATA.    HPI: Richard Reyes is a 49 y.o. right handed, single, caucasian male, who is seen here as a referral from Dr. Brigitte Pulse for consultation. Richard Reyes reports that about 8 years ago he had a urinary tract infection causing him to have nocturia or urinary frequency interrupting her sleep. Until then he had never had trouble to initiate or maintain sleep. On the contrary enough to sleep 10 hours and he felt his sleep was refreshing and restoring. He was given Lunesta to sleep through the night while having nocturia, was waiting for a Urology appointment. He became insomnic. He was referred to [facility] for a Sleep study , was diagnosed with OSA and prescribed CPAP. He had d gained 30 pounds while newly insomnic. He never tolerated the CPAP and FFM, was prescribed Ambien - made phone calls and rearranged furniture , became day time forgetfully, amnestic for conversations. He ow is unemployed, feels the memory is a severe handicap to  his re- employment.  He now looks for a sleep apnea treatment, a new way to be healthier, fitter and more alert.    His bed time 10.30PM , he dims the light 30 minute before. His sleep latency often is 60 minutes or longer - No TV no reading in bed. He goes to the bathroom 2-3 times, he rarely dreams.  He drinks no alcohol, no longer smoking, no caffeine after lunch. When he wakes up at 7.30 (when he left for work, he is now unemployed but keeps his routine) - he is neither refreshed or restored- he feels tired , he doesn't nap. He no longer drinks caffeine in AM. He was exposed to natural daylight.  He gained weight over the last year  after a foot injury, close to 20 pounds.    He has a nasal septal deviation restricting airflow, has rhinitis, he has no retrognathia , but a crowded jaw.    REVIEW OF SYSTEMS: Out of a complete 14 system review of symptoms, the patient complains only of the following symptoms, and all other reviewed systems are negative.  Insomnia, apnea, frequent waking  ALLERGIES: Allergies  Allergen Reactions  . Sulfonamide Derivatives     HOME MEDICATIONS: Outpatient Prescriptions Prior to Visit  Medication Sig Dispense Refill  . amitriptyline (ELAVIL) 50 MG tablet Take 1 tablet (50 mg total) by mouth at bedtime as needed for sleep. 30 tablet 2  . ketoconazole (NIZORAL) 2 % shampoo Apply 1 application topically 2 (two) times a week. 120 mL 1  . PROAIR HFA 108 (90 BASE) MCG/ACT inhaler     . Tretinoin, Facial Wrinkles, (TRETINOIN, EMOLLIENT,) 0.05 % CREA Apply qhs prn 60 g 5  . clonazePAM (KLONOPIN) 0.5 MG tablet Take 1 tablet (0.5 mg total) by mouth 2 (two) times daily as needed for anxiety. (Patient not taking: Reported on 03/22/2014) 60 tablet 2  . ketoconazole (NIZORAL) 2 % cream Apply 1 application topically daily. (Patient not taking: Reported on 03/22/2014) 15 g 1  . ciprofloxacin (CIPRO) 500 MG tablet     . metroNIDAZOLE (METROGEL) 1 % gel Apply topically daily. 60 g 1  . oxyCODONE (OXY IR/ROXICODONE) 5 MG immediate release tablet Take 1-2 tablets (5-10 mg total) by mouth every 4 (four) hours as needed for moderate pain, severe pain or breakthrough pain. 50 tablet 0  . tamsulosin (FLOMAX) 0.4 MG CAPS capsule      No facility-administered medications prior to visit.    PAST MEDICAL HISTORY: Past Medical History  Diagnosis Date  . Anxiety   . OSA on CPAP   . Rhinitis, allergic   . Insomnia w/ sleep apnea   . Asthma     exercised induced    PAST SURGICAL HISTORY: Past Surgical History  Procedure Laterality Date  . Nose surgery    . Hemorrhoid surgery N/A 10/16/2013     Procedure: EXCISION OF EXTERNAL HEMORRHOIDS X 2 AND ANAL EXAM UNDER ANESTHESIA;  Surgeon: Gayland Curry, MD;  Location: [facility];  Service: General;  Laterality: N/A;    FAMILY HISTORY: Family History  Problem Relation Age of Onset  . Cancer Maternal Grandmother     SOCIAL HISTORY: History   Social History  . Marital Status: Single    Spouse Name: N/A  . Number of Children: 0  . Years of Education: 16   Occupational History  .  [employer]   Social History Main Topics  . Smoking status: Former Smoker  Quit date: 05/18/2007  . Smokeless tobacco: Never Used  . Alcohol Use: 0.0 oz/week    0 Standard drinks or equivalent per week     Comment: rarely  . Drug Use: No  . Sexual Activity: Not on file   Other Topics Concern  . Not on file   Social History Narrative   Patient is single and lives alone.   Patient has a college education.   Patient is working full time.   Patient is right-handed.   Patient does not drink any caffeine.      PHYSICAL EXAM  Filed Vitals:   03/22/14 1048  BP: 116/77  Pulse: 91  Height: 5' 9.5" (1.765 m)  Weight: 222 lb (100.699 kg)   Body mass index is 32.32 kg/(m^2).  Generalized: Well developed, in no acute distress  Neck: Circumference 18 inches, Mallampati 4+  Neurological examination  Mentation: Alert oriented to time, place, history taking. Follows all commands speech and language fluent Cranial nerve II-XII: Pupils were equal round reactive to light. Extraocular movements were full, visual field were full on confrontational test. Facial sensation and strength were normal. Uvula tongue midline. Head turning and shoulder shrug  were normal and symmetric. Motor: The motor testing reveals 5 over 5 strength of all 4 extremities. Good symmetric motor tone is noted throughout.  Sensory: Sensory testing is intact to soft touch on all 4 extremities. No evidence of extinction is noted.  Coordination: Cerebellar  testing reveals good finger-nose-finger and heel-to-shin bilaterally.  Gait and station: Gait is normal. Tandem gait is normal. Romberg is negative. No drift is seen.  Reflexes: Deep tendon reflexes are symmetric and normal bilaterally.    DIAGNOSTIC DATA (LABS, IMAGING, TESTING) - I reviewed patient records, labs, notes, testing and imaging myself where available.     ASSESSMENT AND PLAN 49 y.o. year old male  has a past medical history of Anxiety; OSA on CPAP; Rhinitis, allergic; Insomnia w/ sleep apnea; and Asthma. here with:  1. Obstructive sleep apnea on CPAP 2. Insomnia  Patient has not been using his CPAP consistently because he states he sleeps better on the couch. I have encouraged the patient to try using the CPAP on the couch to see if it offers him any improvement in his sleep. The patient also has trouble staying asleep at night. In the past he has tried [place], [place], Ambien without benefit. I will try the patient on Balsomra 5 mg at bedtime as needed. I have reviewed the side effects with the patient. The patient will let me know if he is unable to tolerate this medication. If his symptoms worsen or he develops new symptoms she'll let us know. He will follow-up in 3-4 months for another compliance download.      Richard Givens, MSN, NP-C 03/22/2014, 10:52 AM [facility] [address], [place], [place] [address] [phone]  Note: This document was prepared with digital dictation and possible smart phrase technology. Any transcriptional errors that result from this process are unintentional.

## 2014-03-22 NOTE — Patient Instructions (Addendum)
Try using CPAP nightly while sleeping on the couch. Try Belsomra as needed for sleep.   Suvorexant oral tablets What is this medicine? SUVOREXANT (su-vor-EX-ant) is used to treat insomnia. This medicine helps you to fall asleep and sleep through the night. This medicine may be used for other purposes; ask your health care provider or pharmacist if you have questions. COMMON BRAND NAME(S): Belsomra What should I tell my health care provider before I take this medicine? They need to know if you have any of these conditions: -depression -history of a drug or alcohol abuse problem -history of daytime sleepiness -history of sudden onset of muscle weakness (cataplexy) -liver disease -lung or breathing disease -narcolepsy -suicidal thoughts, plans, or attempt; a previous suicide attempt by you or a family member -an unusual or allergic reaction to suvorexant, other medicines, foods, dyes, or preservatives -pregnant or trying to get pregnant -breast-feeding How should I use this medicine? Take this medicine by mouth within 30 minutes of going to bed. Do not take it unless you are able to stay in bed a full night before you must be active again. Follow the directions on the prescription label. For best results, it is better to take this medicine on an empty stomach. Do not take your medicine more often than directed. Do not stop taking this medicine on your own. Always follow your doctor or health care professional's advice. A special MedGuide will be given to you by the pharmacist with each prescription and refill. Be sure to read this information carefully each time. Talk to your pediatrician regarding the use of this medicine in children. Special care may be needed. Overdosage: If you think you've taken too much of this medicine contact a poison control center or emergency room at once. Overdosage: If you think you have taken too much of this medicine contact a poison control center or emergency  room at once. NOTE: This medicine is only for you. Do not share this medicine with others. What if I miss a dose? This medicine should only be taken immediately before going to sleep. Do not take double or extra doses. What may interact with this medicine? -alcohol -antiviral medicines for HIV or AIDS -aprepitant -carbamazepine -certain antibiotics like ciprofloxacin, clarithromycin, erythromycin, telithromycin -certain medicines for depression or psychotic disturbances -certain medicines for fungal infections like ketoconazole, posaconazole, fluconazole, or itraconazole -conivaptan -digoxin -diltiazem -grapefruit juice -imatinib -medicines for anxiety or sleep -phenytoin -rifampin -verapamil This list may not describe all possible interactions. Give your health care provider a list of all the medicines, herbs, non-prescription drugs, or dietary supplements you use. Also tell them if you smoke, drink alcohol, or use illegal drugs. Some items may interact with your medicine. What should I watch for while using this medicine? Visit your doctor or health care professional for regular checks on your progress. Keep a regular sleep schedule by going to bed at about the same time each night. Avoid caffeine-containing drinks in the evening hours. When sleep medicines are used every night for more than a few weeks, they may stop working. Do not increase the dose on your own. Talk to your doctor if your insomnia worsens or is not better within 7 to 10 days. You may not be able to remember things that you do in the hours after you take this medicine. Some people have reported driving, making phone calls, or preparing and eating food while asleep after taking sleep medicine. Take this medicine right before going to sleep. Tell your doctor  if you are having any problems with your memory. Do not take this medicine unless you are able to stay in bed for a full night (7 to 8 hours) before you must be active  again and do not drive or perform other activities requiring full alertness within 8 hours of a dose. Do not drive, use machinery, or do anything that needs mental alertness the day after you take the 20 mg dose of this medicine. The use of lower doses (10 mg) also has the potential to cause driving impairment the next day. You may have a decrease in mental alertness the day after use, even if you feel that you are fully awake. Tell your doctor if you will need to perform activities requiring full alertness, such as driving, the next day. You may get drowsy or dizzy. Do not stand or sit up quickly, especially if you are an older patient. This reduces the risk of dizzy or fainting spells. Alcohol may interfere with the effect of this medicine. Avoid alcoholic drinks. Do not use this medicine if you have had alcohol that evening or before bed. If you or your family notice any changes in your behavior, or if you have any unusual or disturbing thoughts such as depression or suicidal thoughts, call your doctor right away. What side effects may I notice from receiving this medicine? Side effects that you should report to your doctor or health care professional as soon as possible: -allergic reactions like skin rash, itching or hives, swelling of the face, lips, or tongue -confusion -depressed mood -feeling faint or lightheaded, falls -hallucinations -inability to move or speak for up to several minutes while you are going to sleep or waking up -memory loss -periods of leg weakness lasting from seconds to a few minutes -problems with balance, speaking, walking -restlessness, excitability, or feelings of agitation -unusual activities while asleep like driving, eating, making phone calls Side effects that usually do not require medical attention (Report these to your doctor or health care professional if they continue or are bothersome.): -daytime drowsiness -diarrhea -dizziness -headache This list may  not describe all possible side effects. Call your doctor for medical advice about side effects. You may report side effects to FDA at 1-800-FDA-1088. Where should I keep my medicine? Keep out of the reach of children. This medicine can be abused. Keep your medicine in a safe place to protect it from theft. Do not share this medicine with anyone. Selling or giving away this medicine is dangerous and against the law. Store at room temperature between 15 and 30 degrees C (59 and 86 degrees F). Throw away any unused medicine after the expiration date. NOTE: This sheet is a summary. It may not cover all possible information. If you have questions about this medicine, talk to your doctor, pharmacist, or health care provider.  2015, Elsevier/Gold Standard. (2012-08-24 19:37:59)

## 2014-04-02 ENCOUNTER — Encounter: Payer: Self-pay | Admitting: Neurology

## 2014-06-23 ENCOUNTER — Ambulatory Visit (INDEPENDENT_AMBULATORY_CARE_PROVIDER_SITE_OTHER): Payer: PRIVATE HEALTH INSURANCE | Admitting: Family Medicine

## 2014-06-23 VITALS — BP 124/68 | HR 66 | Temp 98.0°F | Resp 16 | Ht 70.5 in | Wt 227.0 lb

## 2014-06-23 DIAGNOSIS — L989 Disorder of the skin and subcutaneous tissue, unspecified: Secondary | ICD-10-CM | POA: Diagnosis not present

## 2014-06-23 DIAGNOSIS — L918 Other hypertrophic disorders of the skin: Secondary | ICD-10-CM

## 2014-06-23 DIAGNOSIS — R21 Rash and other nonspecific skin eruption: Secondary | ICD-10-CM

## 2014-06-23 DIAGNOSIS — L03312 Cellulitis of back [any part except buttock]: Secondary | ICD-10-CM | POA: Diagnosis not present

## 2014-06-23 MED ORDER — CLOBETASOL PROPIONATE 0.05 % EX CREA: 1.0000 "application " | TOPICAL_CREAM | Freq: Two times a day (BID) | CUTANEOUS | Status: DC

## 2014-06-23 MED ORDER — DOXYCYCLINE HYCLATE 100 MG PO TABS: 100.0000 mg | ORAL_TABLET | Freq: Two times a day (BID) | ORAL | Status: DC

## 2014-06-23 NOTE — Progress Notes (Signed)
Chief Complaint:  Chief Complaint  Patient presents with  . Rash    left, x 3-4 days    HPI: Richard Reyes is a 49 y.o. male who is here for  3-4 day history of rash on the left upper back after he worked out in the yard, he doe snot think anything crawled on him but may have,  he did scratch it, was smaller but now is painful. Does not know what may have bit him. No new travels, meds, lotions, creams soaps. Minimal itchiness. Has not tried anything. Denies fevers or chills. + itchiness. Has tried otc hydrocortisone  He also has skin tags he would like to come off , there is one on his left ear that has been irritating him and also under his left axilla. He states it rubs up against him.   Past Medical History  Diagnosis Date  . Anxiety   . OSA on CPAP   . Rhinitis, allergic   . Insomnia w/ sleep apnea   . Asthma     exercised induced   Past Surgical History  Procedure Laterality Date  . Nose surgery    . Hemorrhoid surgery N/A 10/16/2013    Procedure: EXCISION OF EXTERNAL HEMORRHOIDS X 2 AND ANAL EXAM UNDER ANESTHESIA;  Surgeon: Gayland Curry, MD;  Location: [facility];  Service: General;  Laterality: N/A;   History   Social History  . Marital Status: Single    Spouse Name: N/A  . Number of Children: 0  . Years of Education: 16   Occupational History  .  [employer]   Social History Main Topics  . Smoking status: Former Smoker    Quit date: 05/18/2007  . Smokeless tobacco: Never Used  . Alcohol Use: 0.0 oz/week    0 Standard drinks or equivalent per week     Comment: rarely  . Drug Use: No  . Sexual Activity: Not on file   Other Topics Concern  . None   Social History Narrative   Patient is single and lives alone.   Patient has a college education.   Patient is working full time.   Patient is right-handed.   Patient does not drink any caffeine.   Family History  Problem Relation Age of Onset  . Cancer Maternal Grandmother     Allergies  Allergen Reactions  . Sulfonamide Derivatives    Prior to Admission medications   Medication Sig Start Date End Date Taking? Authorizing Provider  amitriptyline (ELAVIL) 50 MG tablet Take 1 tablet (50 mg total) by mouth at bedtime as needed for sleep. Patient not taking: Reported on 06/23/2014 09/15/13   Shawnee Knapp, MD  clonazePAM (KLONOPIN) 0.5 MG tablet Take 1 tablet (0.5 mg total) by mouth 2 (two) times daily as needed for anxiety. Patient not taking: Reported on 03/22/2014 09/15/13   Shawnee Knapp, MD  ketoconazole (NIZORAL) 2 % cream Apply 1 application topically daily. Patient not taking: Reported on 03/22/2014 08/04/13   Leandrew Koyanagi, MD  ketoconazole (NIZORAL) 2 % shampoo Apply 1 application topically 2 (two) times a week. Patient not taking: Reported on 06/23/2014 08/04/13   Leandrew Koyanagi, MD  PROAIR HFA 108 [phone] BASE) MCG/ACT inhaler  05/11/13   Historical Provider, MD  Suvorexant (BELSOMRA) 5 MG TABS Take 5 mg by mouth at bedtime as needed. Patient not taking: Reported on 06/23/2014 03/22/14   Ward Givens, NP  Tretinoin, Facial Wrinkles, (TRETINOIN, EMOLLIENT,) 0.05 %  CREA Apply qhs prn Patient not taking: Reported on 06/23/2014 09/15/13   Shawnee Knapp, MD     ROS: The patient denies fevers, chills, night sweats, unintentional weight loss, chest pain, palpitations, wheezing, dyspnea on exertion, nausea, vomiting, abdominal pain, dysuria, hematuria, melena, numbness, weakness, or tingling.   All other systems have been reviewed and were otherwise negative with the exception of those mentioned in the HPI and as above.    PHYSICAL EXAM: Filed Vitals:   06/23/14 0830  BP: 124/68  Pulse: 66  Temp: 98 F (36.7 C)  Resp: 16   Filed Vitals:   06/23/14 0830  Height: 5' 10.5" (1.791 m)  Weight: 227 lb (102.967 kg)   Body mass index is 32.1 kg/(m^2).   General: Alert, no acute distress HEENT:  Normocephalic, atraumatic, oropharynx patent. EOMI,  PERRLA Cardiovascular:  Regular rate and rhythm, no rubs murmurs or gallops.  No Carotid bruits, radial pulse intact. No pedal edema.  Respiratory: Clear to auscultation bilaterally.  No wheezes, rales, or rhonchi.  No cyanosis, no use of accessory musculature GI: No organomegaly, abdomen is soft and non-tender, positive bowel sounds.  No masses. Skin: + cellulitic like rash, no vesicles, no abscess, no drainage . + skin tag left ear and axilla, benign appearing.  Neurologic: Facial musculature symmetric. Psychiatric: Patient is appropriate throughout our interaction. Lymphatic: No cervical lymphadenopathy Musculoskeletal: Gait intact.   LABS:  EKG/XRAY:   Primary read interpreted by Dr. Marin Comment at [facility].   ASSESSMENT/PLAN: Encounter Diagnoses  Name Primary?  . Rash and nonspecific skin eruption Yes  . Cellulitis of back except buttock   . Skin tag of ear    Cryotherpay skin tags x 2  Rx doxycyline Rx clobetasol for itching Fu prn   Gross sideeffects, risk and benefits, and alternatives of medications d/w patient. Patient is aware that all medications have potential sideeffects and we are unable to predict every sideeffect or drug-drug interaction that may occur.  Finnigan Warriner, [place], DO 06/28/2014 6:09 PM

## 2014-07-02 ENCOUNTER — Encounter: Payer: Self-pay | Admitting: Adult Health

## 2014-07-02 ENCOUNTER — Ambulatory Visit (INDEPENDENT_AMBULATORY_CARE_PROVIDER_SITE_OTHER): Payer: PRIVATE HEALTH INSURANCE | Admitting: Adult Health

## 2014-07-02 VITALS — BP 125/87 | HR 81 | Ht 71.0 in | Wt 228.0 lb

## 2014-07-02 DIAGNOSIS — G4733 Obstructive sleep apnea (adult) (pediatric): Secondary | ICD-10-CM

## 2014-07-02 DIAGNOSIS — G4700 Insomnia, unspecified: Secondary | ICD-10-CM | POA: Diagnosis not present

## 2014-07-02 DIAGNOSIS — Z9989 Dependence on other enabling machines and devices: Secondary | ICD-10-CM

## 2014-07-02 MED ORDER — TRAZODONE HCL 50 MG PO TABS: 50.0000 mg | ORAL_TABLET | Freq: Every day | ORAL | Status: DC

## 2014-07-02 NOTE — Patient Instructions (Signed)
Trazodone tablets What is this medicine? TRAZODONE (TRAZ oh done) is used to treat depression. This medicine may be used for other purposes; ask your health care provider or pharmacist if you have questions. COMMON BRAND NAME(S): Desyrel What should I tell my health care provider before I take this medicine? They need to know if you have any of these conditions: -attempted suicide or thinking about it -bipolar disorder -bleeding problems -glaucoma -heart disease, or previous heart attack -irregular heart beat -kidney or liver disease -low levels of sodium in the blood -an unusual or allergic reaction to trazodone, other medicines, foods, dyes or preservatives -pregnant or trying to get pregnant -breast-feeding How should I use this medicine? Take this medicine by mouth with a glass of water. Follow the directions on the prescription label. Take this medicine shortly after a meal or a light snack. Take your medicine at regular intervals. Do not take your medicine more often than directed. Do not stop taking this medicine suddenly except upon the advice of your doctor. Stopping this medicine too quickly may cause serious side effects or your condition may worsen. A special MedGuide will be given to you by the pharmacist with each prescription and refill. Be sure to read this information carefully each time. Talk to your pediatrician regarding the use of this medicine in children. Special care may be needed. Overdosage: If you think you have taken too much of this medicine contact a poison control center or emergency room at once. NOTE: This medicine is only for you. Do not share this medicine with others. What if I miss a dose? If you miss a dose, take it as soon as you can. If it is almost time for your next dose, take only that dose. Do not take double or extra doses. What may interact with this medicine? Do not take this medicine with any of the following medications: -certain medicines  for fungal infections like fluconazole, itraconazole, ketoconazole, posaconazole, voriconazole -cisapride -dofetilide -dronedarone -linezolid -MAOIs like Carbex, Eldepryl, Marplan, Nardil, and Parnate -mesoridazine -methylene blue (injected into a vein) -pimozide -saquinavir -thioridazine -ziprasidone This medicine may also interact with the following medications: -alcohol -antiviral medicines for HIV or AIDS -aspirin and aspirin-like medicines -barbiturates like phenobarbital -certain medicines for blood pressure, heart disease, irregular heart beat -certain medicines for depression, anxiety, or psychotic disturbances -certain medicines for migraine headache like almotriptan, eletriptan, frovatriptan, naratriptan, rizatriptan, sumatriptan, zolmitriptan -certain medicines for seizures like carbamazepine and phenytoin -certain medicines for sleep -certain medicines that treat or prevent blood clots like dalteparin, enoxaparin, warfarin -digoxin -fentanyl -lithium -NSAIDS, medicines for pain and inflammation, like ibuprofen or naproxen -other medicines that prolong the QT interval (cause an abnormal heart rhythm) -rasagiline -supplements like St. John's wort, kava kava, valerian -tramadol -tryptophan This list may not describe all possible interactions. Give your health care provider a list of all the medicines, herbs, non-prescription drugs, or dietary supplements you use. Also tell them if you smoke, drink alcohol, or use illegal drugs. Some items may interact with your medicine. What should I watch for while using this medicine? Tell your doctor if your symptoms do not get better or if they get worse. Visit your doctor or health care professional for regular checks on your progress. Because it may take several weeks to see the full effects of this medicine, it is important to continue your treatment as prescribed by your doctor. Patients and their families should watch out for new  or worsening thoughts of suicide or depression. Also   watch out for sudden changes in feelings such as feeling anxious, agitated, panicky, irritable, hostile, aggressive, impulsive, severely restless, overly excited and hyperactive, or not being able to sleep. If this happens, especially at the beginning of treatment or after a change in dose, call your health care professional. Dennis Bast may get drowsy or dizzy. Do not drive, use machinery, or do anything that needs mental alertness until you know how this medicine affects you. Do not stand or sit up quickly, especially if you are an older patient. This reduces the risk of dizzy or fainting spells. Alcohol may interfere with the effect of this medicine. Avoid alcoholic drinks. This medicine may cause dry eyes and blurred vision. If you wear contact lenses you may feel some discomfort. Lubricating drops may help. See your eye doctor if the problem does not go away or is severe. Your mouth may get dry. Chewing sugarless gum, sucking hard candy and drinking plenty of water may help. Contact your doctor if the problem does not go away or is severe. What side effects may I notice from receiving this medicine? Side effects that you should report to your doctor or health care professional as soon as possible: -allergic reactions like skin rash, itching or hives, swelling of the face, lips, or tongue -fast, irregular heartbeat -feeling faint or lightheaded, falls -painful erections or other sexual dysfunction -suicidal thoughts or other mood changes -trembling Side effects that usually do not require medical attention (report to your doctor or health care professional if they continue or are bothersome): -constipation -headache -muscle aches or pains -nausea, vomiting -unusually weak or tired This list may not describe all possible side effects. Call your doctor for medical advice about side effects. You may report side effects to FDA at 1-800-FDA-1088. Where  should I keep my medicine? Keep out of the reach of children. Store at room temperature between 15 and 30 degrees C (59 to 86 degrees F). Protect from light. Keep container tightly closed. Throw away any unused medicine after the expiration date. NOTE: This sheet is a summary. It may not cover all possible information. If you have questions about this medicine, talk to your doctor, pharmacist, or health care provider.  2015, Elsevier/Gold Standard. (2012-07-25 15:46:28)

## 2014-07-02 NOTE — Progress Notes (Addendum)
PATIENT: Richard Reyes DOB: [birth year]  REASON FOR VISIT: follow up- OSA on CPAP HISTORY FROM: patient  HISTORY OF PRESENT ILLNESS: Richard Reyes is a 49 year old male with a history of obstructive sleep apnea and insomnia. He returns today for a 30 day compliance download. His compliance download indicates that he uses machine 22 out of 30 days for a compliance of 73%. He uses the machine greater than 4 hours 11 out of 30 days for compliance of 37%. His average usage is 4 hours and 14 minutes. His AHI is 1.5 at 8 cm of water with EPR level at 1. The patient's leak at the 95th percentile is 28.6 L/Min. He reports that his mask and straps have not been replaced in over a year. Epworth score is 5 and fatigue severity score is 18. He does notice that he will have a leak at times. The patient was started on Belsomra at the last visit. Patient states that he did not notice the benefit. He states that he doubled his medication but still did not see any benefit. Patient states that he still has a hard time falling asleep. Patient does endorse good sleep hygiene. He eliminates TV/phone/tablet 1 hour before bedtime. His room is cold and dark. He does not drink caffeine after 12 PM. In the past the patient has tried Ambien, [place] and Sonata without benefit. He returns today for evaluation.  HISTORY 03/22/14: Richard Reyes is a 49 year old male with history of obstructive sleep Apnea. Marland Kitchen He returns today for a 30 day compliance download. He brought his machine with him today and his reports shows an AHI of 5.0 at 8 cm of water, uses his machine for 3 hours and 42 minutes a night, with 47% compliance. He uses his machine greater than 4 hours only for 30 days with a compliance of 13%. Patient states that he has not been using his machine because he has been sleeping on the couch. He states that he feels like he sleeps better on the couch. His Epworth score is 3 points was previously 8 points. His fatigue severity  score is 24 was previously 33.Patient reports that he gets about 8 hours of sleep a night. He goes to bed around 10:30PM and arises at 6:30AM. He continues to have a have a hard time staying asleep. He states that he will wake up multiple times during the night. He has tried Quarry manager, Ambien and Lunesta in the past without benefit. States that the gets up about 2-3 times a night to urinate. .Since the last visit the patient has had no new medical issues.   History 07/17/13 ([place]):  Richard Reyes returned for a split night polysomnography on 06-02-13 he had endorsed the Epworth score at 12 points and sleepy HQ form for depression and 8 points. The patient's AHI was 47.3 and RDI 51.2. The patient had no PLMs of sleep . Average heart rate remained in normal sinus rhythm 67 beats per minute.  He was titrated to 8 cm water with a reduction of the AHI to 1.6. Supine AHI was still around 9 versus nonsupine AHI of 1.2 .  I have new compliance report available today dated 7-12/15 showing 100% compliance for the patient average time of use of 7hours and 4 minutes at night 8 cm water pressure this 1 cm EPI or residual AHI is 2.3. The patient is a mouth breather but has a nasal pillow as well as a fullface mask available and uses a  nasal spray.  His Epworth sleepiness score today is 8 points from previously 12 his fatigue severity score of 33 points .   He is still complaining about insomnia, but less than prior to CPAP use, he is more alert, no nocturia. He tends to wake up early. I suggested a trial of SONATA.  HISTORY: Richard Reyes is a 49 y.o. right handed, single, caucasian male, who is seen here as a referral from Dr. Brigitte Pulse for consultation. Richard Reyes reports that about 8 years ago he had a urinary tract infection causing him to have nocturia or urinary frequency interrupting her sleep. Until then he had never had trouble to initiate or maintain sleep. On the contrary enough to sleep 10 hours and he  felt his sleep was refreshing and restoring. He was given Lunesta to sleep through the night while having nocturia, was waiting for a Urology appointment. He became insomnic. He was referred to [facility] for [facility] Sleep study , was diagnosed with OSA and prescribed CPAP. He had d gained 30 pounds while newly insomnic. He never tolerated the CPAP and FFM, was prescribed Ambien - made phone calls and rearranged furniture , became day time forgetfully, amnestic for conversations. He ow is unemployed, feels the memory is a severe handicap to his re- employment.  He now looks for a sleep apnea treatment, a new way to be healthier, fitter and more alert.  His bed time 10.30PM , he dims the light 30 minute before. His sleep latency often is 60 minutes or longer - No TV no reading in bed. He goes to the bathroom 2-3 times, he rarely dreams.  He drinks no alcohol, no longer smoking, no caffeine after lunch. When he wakes up at 7.30 (when he left for work, he is now unemployed but keeps his routine) - he is neither refreshed or restored- he feels tired , he doesn't nap. He no longer drinks caffeine in AM. He was exposed to natural daylight.  He gained weight over the last year after a foot injury, close to 20 pounds.  He has a nasal septal deviation restricting airflow, has rhinitis, he has no retrognathia , but a crowded jaw.   REVIEW OF SYSTEMS: Out of a complete 14 system review of symptoms, the patient complains only of the following symptoms, and all other reviewed systems are negative.  See history of present illness  ALLERGIES: Allergies  Allergen Reactions  . Sulfonamide Derivatives     HOME MEDICATIONS: Outpatient Prescriptions Prior to Visit  Medication Sig Dispense Refill  . clobetasol cream (TEMOVATE) 4.92 % Apply 1 application topically 2 (two) times daily. Prn 30 g 0  . doxycycline (VIBRA-TABS) 100 MG tablet Take 1 tablet (100 mg total) by mouth 2 (two) times daily. 28 tablet 0  . PROAIR HFA  108 (90 BASE) MCG/ACT inhaler      No facility-administered medications prior to visit.    PAST MEDICAL HISTORY: Past Medical History  Diagnosis Date  . Anxiety   . OSA on CPAP   . Rhinitis, allergic   . Insomnia w/ sleep apnea   . Asthma     exercised induced    PAST SURGICAL HISTORY: Past Surgical History  Procedure Laterality Date  . Nose surgery    . Hemorrhoid surgery N/A 10/16/2013    Procedure: EXCISION OF EXTERNAL HEMORRHOIDS X 2 AND ANAL EXAM UNDER ANESTHESIA;  Surgeon: Gayland Curry, MD;  Location: [facility];  Service: General;  Laterality: N/A;  FAMILY HISTORY: Family History  Problem Relation Age of Onset  . Cancer Maternal Grandmother     SOCIAL HISTORY: History   Social History  . Marital Status: Single    Spouse Name: N/A  . Number of Children: 0  . Years of Education: 16   Occupational History  .  [employer]   Social History Main Topics  . Smoking status: Former Smoker    Quit date: 05/18/2007  . Smokeless tobacco: Never Used  . Alcohol Use: 0.0 oz/week    0 Standard drinks or equivalent per week     Comment: rarely  . Drug Use: No  . Sexual Activity: Not on file   Other Topics Concern  . Not on file   Social History Narrative   Patient is single and lives alone.   Patient has a college education.   Patient is working full time.   Patient is right-handed.   Patient does not drink any caffeine.      PHYSICAL EXAM  Filed Vitals:   07/02/14 0804  Height: 5\' 11"  (1.803 m)  Weight: 228 lb (103.42 kg)   Body mass index is 31.81 kg/(m^2).  Generalized: Well developed, in no acute distress  Neck: Circumference 18-1/2 inches, Mallampati 3+   Neurological examination  Mentation: Alert oriented to time, place, history taking. Follows all commands speech and language fluent Cranial nerve II-XII: Pupils were equal round reactive to light. Extraocular movements were full, visual field were full on confrontational  test. Facial sensation and strength were normal. Uvula tongue midline. Head turning and shoulder shrug  were normal and symmetric. Motor: The motor testing reveals 5 over 5 strength of all 4 extremities. Good symmetric motor tone is noted throughout.  Sensory: Sensory testing is intact to soft touch on all 4 extremities. No evidence of extinction is noted.  Coordination: Cerebellar testing reveals good finger-nose-finger and heel-to-shin bilaterally.  Gait and station: Gait is normal. Tandem gait is normal. Romberg is negative. No drift is seen.  Reflexes: Deep tendon reflexes are symmetric and normal bilaterally.   DIAGNOSTIC DATA (LABS, IMAGING, TESTING) - I reviewed patient records, labs, notes, testing and imaging myself where available.    ASSESSMENT AND PLAN 49 y.o. year old male  has a past medical history of Anxiety; OSA on CPAP; Rhinitis, allergic; Insomnia w/ sleep apnea; and Asthma. here with:  1. OSA on CPAP 2. Insomnia  The patient did increase his usage on the CPAP. However I have encouraged the patient to use his machine for greater than 4 hours. I will provide the patient with an order to have his mask, tubing and straps replaced. I will also try the patient on a low-dose of trazodone to help with sleep. The patient will let me know if this is beneficial. I have provided the patient with a handout that goes over the side effects of trazodone. If his symptoms worsen or he develops new symptoms he will let us know. Otherwise he'll follow-up in 4 months or sooner if needed.   Ward Givens, MSN, NP-C 07/02/2014, 8:04 AM [facility] [address] [place], [place] [address] [phone]  Note: This document was prepared with digital dictation and possible smart phrase technology. Any transcriptional errors that result from this process are unintentional.  I reviewed the above note and documentation by the Nurse Practitioner and agree with the  history, physical exam, assessment and plan as outlined above. I was immediately available for face-to-face consultation. Star Age,  MD, PhD [facility] ([place])

## 2014-07-02 NOTE — Addendum Note (Signed)
Addended by: Trudie Buckler on: 07/02/2014 09:57 AM   Modules accepted: Orders

## 2014-08-08 ENCOUNTER — Ambulatory Visit (INDEPENDENT_AMBULATORY_CARE_PROVIDER_SITE_OTHER): Payer: PRIVATE HEALTH INSURANCE | Admitting: Family Medicine

## 2014-08-08 VITALS — BP 132/86 | HR 78 | Temp 98.3°F | Resp 18 | Ht 70.0 in | Wt 222.0 lb

## 2014-08-08 DIAGNOSIS — Z1329 Encounter for screening for other suspected endocrine disorder: Secondary | ICD-10-CM

## 2014-08-08 DIAGNOSIS — Z130 Encounter for screening for diseases of the blood and blood-forming organs and certain disorders involving the immune mechanism: Secondary | ICD-10-CM | POA: Diagnosis not present

## 2014-08-08 DIAGNOSIS — N3090 Cystitis, unspecified without hematuria: Secondary | ICD-10-CM

## 2014-08-08 DIAGNOSIS — R7989 Other specified abnormal findings of blood chemistry: Secondary | ICD-10-CM

## 2014-08-08 DIAGNOSIS — R350 Frequency of micturition: Secondary | ICD-10-CM

## 2014-08-08 DIAGNOSIS — Z1211 Encounter for screening for malignant neoplasm of colon: Secondary | ICD-10-CM

## 2014-08-08 DIAGNOSIS — E291 Testicular hypofunction: Secondary | ICD-10-CM | POA: Diagnosis not present

## 2014-08-08 DIAGNOSIS — Z125 Encounter for screening for malignant neoplasm of prostate: Secondary | ICD-10-CM | POA: Diagnosis not present

## 2014-08-08 DIAGNOSIS — Z0000 Encounter for general adult medical examination without abnormal findings: Secondary | ICD-10-CM | POA: Diagnosis not present

## 2014-08-08 DIAGNOSIS — Z13220 Encounter for screening for lipoid disorders: Secondary | ICD-10-CM | POA: Diagnosis not present

## 2014-08-08 LAB — POCT URINALYSIS DIPSTICK
Bilirubin, UA: NEGATIVE
Glucose, UA: NEGATIVE
Leukocytes, UA: NEGATIVE
Nitrite, UA: NEGATIVE
Protein, UA: NEGATIVE
Spec Grav, UA: 1.025
Urobilinogen, UA: 0.2
pH, UA: 6

## 2014-08-08 LAB — POCT CBC
Granulocyte percent: 62.4 %G (ref 37–80)
HCT, POC: 46.1 % (ref 43.5–53.7)
Hemoglobin: 15.4 g/dL (ref 14.1–18.1)
Lymph, poc: 3.5 — AB (ref 0.6–3.4)
MCH, POC: 31 pg (ref 27–31.2)
MCHC: 33.4 g/dL (ref 31.8–35.4)
MCV: 92.6 fL (ref 80–97)
MID (cbc): 0.2 (ref 0–0.9)
MPV: 7.6 fL (ref 0–99.8)
POC Granulocyte: 6.2 (ref 2–6.9)
POC LYMPH PERCENT: 35.5 %L (ref 10–50)
POC MID %: 2.1 %M (ref 0–12)
Platelet Count, POC: 332 10*3/uL (ref 142–424)
RBC: 4.97 M/uL (ref 4.69–6.13)
RDW, POC: 13.5 %
WBC: 9.9 10*3/uL (ref 4.6–10.2)

## 2014-08-08 LAB — POCT UA - MICROSCOPIC ONLY
Casts, Ur, LPF, POC: NEGATIVE
Crystals, Ur, HPF, POC: NEGATIVE
Mucus, UA: NEGATIVE
Yeast, UA: NEGATIVE

## 2014-08-08 MED ORDER — NYSTATIN 100000 UNIT/GM EX CREA: 1.0000 "application " | TOPICAL_CREAM | Freq: Two times a day (BID) | CUTANEOUS | Status: DC

## 2014-08-08 MED ORDER — DOXYCYCLINE HYCLATE 100 MG PO TABS
100.0000 mg | ORAL_TABLET | Freq: Two times a day (BID) | ORAL | Status: DC
Start: 2014-08-08 — End: 2014-11-05

## 2014-08-08 NOTE — Patient Instructions (Signed)

## 2014-08-08 NOTE — Progress Notes (Signed)
Chief Complaint:  Chief Complaint  Patient presents with  . Urinary Frequency    x 4-5 days  . Other    metallic taste in mouth  . Hemorrhoids    HPI: Richard Reyes is a 49 y.o. male who reports to [facility] today complaining of :  Urinary frequency 4-5 days, no discharge, he had problems based on diet. Acidic food and caffeine make it worse, it takes about 30 days to get his urinary sxs back to normal.  Urology thought he had BPH and possibly an elevated PSA ,  he had prostant samples and was fine, he was treated in the past for similar symptoms for 30 days on abx, he denies any prostate pain currently He has had some hemorrhoids and had an elective hemorrhoidectomy last year. HE ahs some anal itching and uses wipes. But still persistent. Has used hydrcortisone cream without releif. Does sweat a lot.  He would like an annual as well.  Colonoscopy: has not had performed Prostate cancer screening/last PSA: has had prostate biopsy by [facility] few years ago - normal then.  Tetanus/Tdap: UTD  Dentist: last eval about 6 months ago. 10 fake teeth Optho/eye care eval: [facility]  OSA - diagnosed few weeks ago. Using CPAP. Doing ok. Tolerating mask. Sleeping better.  Hx of low testosterone. No recent treatment.  Insomia sxs improved with trazadone but not normal, he wishe dhe never started on lunesta, he has tried [place] and belsaomra and both do not work    Past Medical History  Diagnosis Date  . Anxiety   . OSA on CPAP   . Rhinitis, allergic   . Insomnia w/ sleep apnea   . Asthma     exercised induced   Past Surgical History  Procedure Laterality Date  . Nose surgery    . Hemorrhoid surgery N/A 10/16/2013    Procedure: EXCISION OF EXTERNAL HEMORRHOIDS X 2 AND ANAL EXAM UNDER ANESTHESIA;  Surgeon: Gayland Curry, MD;  Location: [place];  Service: General;  Laterality: N/A;   History   Social History  . Marital Status: Single   Spouse Name: N/A  . Number of Children: 0  . Years of Education: 16   Occupational History  .  [employer]   Social History Main Topics  . Smoking status: Former Smoker    Quit date: 05/18/2007  . Smokeless tobacco: Never Used  . Alcohol Use: 0.0 oz/week    0 Standard drinks or equivalent per week     Comment: rarely  . Drug Use: No  . Sexual Activity: Not on file   Other Topics Concern  . None   Social History Narrative   Patient is single and lives alone.   Patient has a college education.   Patient is working full time.   Patient is right-handed.   Patient does not drink any caffeine.   Family History  Problem Relation Age of Onset  . Cancer Maternal Grandmother    Allergies  Allergen Reactions  . Sulfonamide Derivatives    Prior to Admission medications   Medication Sig Start Date End Date Taking? Authorizing Provider  PROAIR HFA 108 (90 BASE) MCG/ACT inhaler  05/11/13  Yes Historical Provider, MD  traZODone (DESYREL) 50 MG tablet Take 1 tablet (50 mg total) by mouth at bedtime. 07/02/14  Yes Ward Givens, NP  BELSOMRA 5 MG TABS  04/23/14   Historical Provider, MD     ROS: The  patient denies fevers, chills, night sweats, unintentional weight loss, chest pain, palpitations, wheezing, dyspnea on exertion, nausea, vomiting, abdominal pain,  hematuria, melena, numbness, weakness, or tingling.   All other systems have been reviewed and were otherwise negative with the exception of those mentioned in the HPI and as above.    PHYSICAL EXAM: Filed Vitals:   08/08/14 1813  BP: 132/86  Pulse: 78  Temp: 98.3 F (36.8 C)  Resp: 18   Body mass index is 31.85 kg/(m^2).   General: Alert, no acute distress HEENT:  Normocephalic, atraumatic, oropharynx patent. EOMI, PERRLA, no thryoidmegaly, dentition shows no dental caries, salviary glands normal , TM normal, tongue appears normal Cardiovascular:  Regular rate and rhythm, no rubs murmurs or gallops.  No Carotid  bruits, radial pulse intact. No pedal edema.  Respiratory: Clear to auscultation bilaterally.  No wheezes, rales, or rhonchi.  No cyanosis, no use of accessory musculature Abdominal: No organomegaly, abdomen is soft and non-tender, positive bowel sounds. No masses. Skin: No rashes. Neurologic: Facial musculature symmetric. Psychiatric: Patient acts appropriately throughout our interaction. Lymphatic: No cervical or submandibular lymphadenopathy Musculoskeletal: Gait intact. No edema, tenderness 55 strength Prostate exam normal He has diaper like erythematous  rash on rectal area with minimally raised external hemorrhoids    LABS: Results for orders placed or performed in visit on 08/08/14  POCT CBC  Result Value Ref Range   WBC 9.9 4.6 - 10.2 K/uL   Lymph, poc 3.5 (A) 0.6 - 3.4   POC LYMPH PERCENT 35.5 10 - 50 %L   MID (cbc) 0.2 0 - 0.9   POC MID % 2.1 0 - 12 %M   POC Granulocyte 6.2 2 - 6.9   Granulocyte percent 62.4 37 - 80 %G   RBC 4.97 4.69 - 6.13 M/uL   Hemoglobin 15.4 14.1 - 18.1 g/dL   HCT, POC 46.1 43.5 - 53.7 %   MCV 92.6 80 - 97 fL   MCH, POC 31.0 27 - 31.2 pg   MCHC 33.4 31.8 - 35.4 g/dL   RDW, POC 13.5 %   Platelet Count, POC 332 142 - 424 K/uL   MPV 7.6 0 - 99.8 fL  POCT UA - Microscopic Only  Result Value Ref Range   WBC, Ur, HPF, POC 0-1    RBC, urine, microscopic 1-3    Bacteria, U Microscopic trace    Mucus, UA neg    Epithelial cells, urine per micros 0-1    Crystals, Ur, HPF, POC neg    Casts, Ur, LPF, POC neg    Yeast, UA neg   POCT urinalysis dipstick  Result Value Ref Range   Color, UA yellow    Clarity, UA clear    Glucose, UA neg    Bilirubin, UA neg    Ketones, UA trace    Spec Grav, UA 1.025    Blood, UA small    pH, UA 6.0    Protein, UA neg    Urobilinogen, UA 0.2    Nitrite, UA neg    Leukocytes, UA Negative Negative     EKG/XRAY:   Primary read interpreted by Dr. Marin Comment at [facility].   ASSESSMENT/PLAN: Encounter Diagnoses  Name  Primary?  . Annual physical exam Yes  . Screening for deficiency anemia   . Screening for hyperlipidemia   . Screening for thyroid disorder   . Special screening for malignant neoplasms, colon   . Urine frequency   . Low testosterone   . Cystitis   .  Screening for prostate cancer    Labs pending Rx nystatin cream for rectal area, I think more than anything he needs a barrier cream like destin or zinc oxide sicne he sweats a lot Hemorrhoid care ie sitz baths Refer to Gi for colonscopy RX Doxycyline 100 mg BID x 14 d ays for cystitis/less likley early prostatis. Cannot explain synptoms of metallic taste in mouth, will see if improved in next 1 week or so Fu prn   Gross sideeffects, risk and benefits, and alternatives of medications d/w patient. Patient is aware that all medications have potential sideeffects and we are unable to predict every sideeffect or drug-drug interaction that may occur.  Stacie Knutzen DO  08/08/2014 7:24 PM

## 2014-08-09 LAB — COMPLETE METABOLIC PANEL WITH GFR
ALT: 59 U/L — ABNORMAL HIGH (ref 9–46)
AST: 37 U/L — ABNORMAL HIGH (ref 10–35)
Albumin: 4.5 g/dL (ref 3.6–5.1)
Alkaline Phosphatase: 63 U/L (ref 40–115)
BUN: 11 mg/dL (ref 7–25)
CO2: 29 mmol/L (ref 20–31)
Calcium: 9.7 mg/dL (ref 8.6–10.3)
Chloride: 101 mmol/L (ref 98–110)
Creat: 1.07 mg/dL (ref 0.70–1.33)
GFR, Est African American: >89 mL/min (ref >=60)
GFR, Est Non African American: 80 mL/min (ref >=60)
Glucose, Bld: 84 mg/dL (ref 65–99)
Potassium: 4.3 mmol/L (ref 3.5–5.3)
Sodium: 140 mmol/L (ref 135–146)
Total Bilirubin: 0.6 mg/dL (ref 0.2–1.2)
Total Protein: 7.8 g/dL (ref 6.1–8.1)

## 2014-08-09 LAB — LIPID PANEL
Cholesterol: 190 mg/dL (ref 125–200)
HDL: 39 mg/dL — ABNORMAL LOW (ref >=40)
LDL Cholesterol: 126 mg/dL (ref <130)
Total CHOL/HDL Ratio: 4.9 Ratio (ref <=5.0)
Triglycerides: 126 mg/dL (ref <150)
VLDL: 25 mg/dL (ref <30)

## 2014-08-09 LAB — HIV ANTIBODY (ROUTINE TESTING W REFLEX): HIV 1&2 Ab, 4th Generation: NONREACTIVE

## 2014-08-09 LAB — TSH: TSH: 2.948 u[IU]/mL (ref 0.350–4.500)

## 2014-08-09 LAB — RPR

## 2014-08-10 LAB — GC/CHLAMYDIA PROBE AMP
CT Probe RNA: NEGATIVE
GC Probe RNA: NEGATIVE

## 2014-08-10 LAB — PSA: PSA: 1.14 ng/mL (ref <=4.00)

## 2014-08-17 ENCOUNTER — Other Ambulatory Visit: Payer: Self-pay | Admitting: Family Medicine

## 2014-08-17 DIAGNOSIS — R7989 Other specified abnormal findings of blood chemistry: Secondary | ICD-10-CM

## 2014-08-18 ENCOUNTER — Other Ambulatory Visit (INDEPENDENT_AMBULATORY_CARE_PROVIDER_SITE_OTHER): Payer: PRIVATE HEALTH INSURANCE | Admitting: *Deleted

## 2014-08-18 DIAGNOSIS — Z0000 Encounter for general adult medical examination without abnormal findings: Secondary | ICD-10-CM

## 2014-08-18 DIAGNOSIS — E291 Testicular hypofunction: Secondary | ICD-10-CM | POA: Diagnosis not present

## 2014-08-18 DIAGNOSIS — R7989 Other specified abnormal findings of blood chemistry: Secondary | ICD-10-CM

## 2014-08-18 NOTE — Progress Notes (Signed)
Blood draw only.

## 2014-08-20 LAB — TESTOSTERONE, FREE, TOTAL, SHBG
Sex Hormone Binding: 24 nmol/L (ref 10–50)
Testosterone, Free: 75.3 pg/mL (ref 47.0–244.0)
Testosterone-% Free: 2.4 % (ref 1.6–2.9)
Testosterone: 320 ng/dL (ref 300–890)

## 2014-09-11 ENCOUNTER — Encounter: Payer: Self-pay | Admitting: Gastroenterology

## 2014-11-05 ENCOUNTER — Encounter: Payer: Self-pay | Admitting: Adult Health

## 2014-11-05 ENCOUNTER — Ambulatory Visit (INDEPENDENT_AMBULATORY_CARE_PROVIDER_SITE_OTHER): Payer: Managed Care, Other (non HMO) | Admitting: Adult Health

## 2014-11-05 VITALS — BP 126/74 | HR 84 | Ht 70.0 in | Wt 231.5 lb

## 2014-11-05 DIAGNOSIS — G4700 Insomnia, unspecified: Secondary | ICD-10-CM

## 2014-11-05 DIAGNOSIS — G4733 Obstructive sleep apnea (adult) (pediatric): Secondary | ICD-10-CM | POA: Diagnosis not present

## 2014-11-05 DIAGNOSIS — Z9989 Dependence on other enabling machines and devices: Principal | ICD-10-CM

## 2014-11-05 MED ORDER — TRAZODONE HCL 50 MG PO TABS: 100.0000 mg | ORAL_TABLET | Freq: Every day | ORAL | Status: DC

## 2014-11-05 NOTE — Patient Instructions (Signed)
Increase Trazodone to 2 tablets at bedtime (100mg ).  Try do use CPAP >4 hours each night.  If your symptoms worsen or you develop new symptoms please let us know.

## 2014-11-05 NOTE — Progress Notes (Signed)
I agree with the assessment and plan as directed by NP .The patient's condition  is known to me .   Marbeth Smedley, MD  Medical director at [facility]

## 2014-11-05 NOTE — Progress Notes (Signed)
PATIENT: Richard Reyes DOB: May 06, 1965  REASON FOR VISIT: follow up-obstructive sleep apnea, insomnia HISTORY FROM: patient  HISTORY OF PRESENT ILLNESS: Richard Reyes is a 49 year old male with a history of obstructive sleep apnea and insomnia. He returns today for a compliance download. His download indicates that he uses machine 25 out of 30 days for compliance of 83%. He on average uses his machine 4 hours and 57 minutes. He uses machine greater than 4 hours 15 out of 30 days for compliance of 50%. The patient's AHI is 4.4 with a pressure of 8 cm of water and EPR of 1. The patient's leak in the 95th percentile is 32.3 L/m. The patient reports that occasionally at night the mask will slide off as he moves around in bed. The patient states that he continues to have trouble with falling and staying asleep. He has tried trazodone but has not noticed a big difference. However he is not using the trazodone consistently every night. He does state that he tied taking 2 tablets of the trazodone and he feels that that may have been beneficial. He does not want to try any medication that is addictive or any hallucinogenic or hypnotic medication. He returns today for an evaluation.  HISTORY 07/02/14: Richard Reyes is a 49 year old male with a history of obstructive sleep apnea and insomnia. He returns today for a 30 day compliance download. His compliance download indicates that he uses machine 22 out of 30 days for a compliance of 73%. He uses the machine greater than 4 hours 11 out of 30 days for compliance of 37%. His average usage is 4 hours and 14 minutes. His AHI is 1.5 at 8 cm of water with EPR level at 1. The patient's leak at the 95th percentile is 28.6 L/Min. He reports that his mask and straps have not been replaced in over a year. Epworth score is 5 and fatigue severity score is 18. He does notice that he will have a leak at times. The patient was started on Belsomra at the last visit. Patient states  that he did not notice the benefit. He states that he doubled his medication but still did not see any benefit. Patient states that he still has a hard time falling asleep. Patient does endorse good sleep hygiene. He eliminates TV/phone/tablet 1 hour before bedtime. His room is cold and dark. He does not drink caffeine after 12 PM. In the past the patient has tried Ambien, [place] and Sonata without benefit. He returns today for evaluation.  HISTORY 03/22/14: Richard Reyes is a 49 year old male with history of obstructive sleep Apnea. Marland Kitchen He returns today for a 30 day compliance download. He brought his machine with him today and his reports shows an AHI of 5.0 at 8 cm of water, uses his machine for 3 hours and 42 minutes a night, with 47% compliance. He uses his machine greater than 4 hours only for 30 days with a compliance of 13%. Patient states that he has not been using his machine because he has been sleeping on the couch. He states that he feels like he sleeps better on the couch. His Epworth score is 3 points was previously 8 points. His fatigue severity score is 24 was previously 33.Patient reports that he gets about 8 hours of sleep a night. He goes to bed around 10:30PM and arises at 6:30AM. He continues to have a have a hard time staying asleep. He states that he will wake up  multiple times during the night. He has tried Quarry manager, Ambien and Lunesta in the past without benefit. States that the gets up about 2-3 times a night to urinate. .Since the last visit the patient has had no new medical issues.    REVIEW OF SYSTEMS: Out of a complete 14 system review of symptoms, the patient complains only of the following symptoms, and all other reviewed systems are negative.  Epworth Sleepiness Scale score 5, fatigue severity score 12  ALLERGIES: Allergies  Allergen Reactions  . Sulfonamide Derivatives     HOME MEDICATIONS: Outpatient Prescriptions Prior to Visit  Medication Sig Dispense Refill  .  nystatin cream (MYCOSTATIN) Apply 1 application topically 2 (two) times daily. 30 g 0  . PROAIR HFA 108 (90 BASE) MCG/ACT inhaler     . traZODone (DESYREL) 50 MG tablet Take 1 tablet (50 mg total) by mouth at bedtime. 30 tablet 3  . doxycycline (VIBRA-TABS) 100 MG tablet Take 1 tablet (100 mg total) by mouth 2 (two) times daily. (Patient not taking: Reported on 11/05/2014) 28 tablet 0   No facility-administered medications prior to visit.    PAST MEDICAL HISTORY: Past Medical History  Diagnosis Date  . Anxiety   . OSA on CPAP   . Rhinitis, allergic   . Insomnia w/ sleep apnea   . Asthma     exercised induced    PAST SURGICAL HISTORY: Past Surgical History  Procedure Laterality Date  . Nose surgery    . Hemorrhoid surgery N/A 10/16/2013    Procedure: EXCISION OF EXTERNAL HEMORRHOIDS X 2 AND ANAL EXAM UNDER ANESTHESIA;  Surgeon: Gayland Curry, MD;  Location: [place];  Service: General;  Laterality: N/A;    FAMILY HISTORY: Family History  Problem Relation Age of Onset  . Cancer Maternal Grandmother     SOCIAL HISTORY: Social History   Social History  . Marital Status: Single    Spouse Name: N/A  . Number of Children: 0  . Years of Education: 16   Occupational History  .  [employer]   Social History Main Topics  . Smoking status: Former Smoker    Quit date: 05/18/2007  . Smokeless tobacco: Never Used  . Alcohol Use: 0.0 oz/week    0 Standard drinks or equivalent per week     Comment: rarely  . Drug Use: No  . Sexual Activity: Not on file   Other Topics Concern  . Not on file   Social History Narrative   Patient is single and lives alone.   Patient has a college education.   Patient is working full time.   Patient is right-handed.   Patient does not drink any caffeine.      PHYSICAL EXAM  Filed Vitals:   11/05/14 0751  BP: 126/74  Pulse: 84  Height: 5\' 10"  (1.778 m)  Weight: 231 lb 8 oz (105.008 kg)   Body mass index is  33.22 kg/(m^2).  Generalized: Well developed, in no acute distress  Neck: Circumference 19 inches, Mallampati 3+  Neurological examination  Mentation: Alert oriented to time, place, history taking. Follows all commands speech and language fluent Cranial nerve II-XII: Pupils were equal round reactive to light. Extraocular movements were full, visual field were full on confrontational test. Facial sensation and strength were normal. Uvula tongue midline. Head turning and shoulder shrug  were normal and symmetric. Motor: The motor testing reveals 5 over 5 strength of all 4 extremities. Good symmetric motor tone is noted  throughout.  Sensory: Sensory testing is intact to soft touch on all 4 extremities. No evidence of extinction is noted.  Coordination: Cerebellar testing reveals good finger-nose-finger and heel-to-shin bilaterally.  Gait and station: Gait is normal. Tandem gait is normal. Romberg is negative. No drift is seen.  Reflexes: Deep tendon reflexes are symmetric and normal bilaterally.   DIAGNOSTIC DATA (LABS, IMAGING, TESTING) - I reviewed patient records, labs, notes, testing and imaging myself where available.  Lab Results  Component Value Date   WBC 9.9 08/08/2014   HGB 15.4 08/08/2014   HCT 46.1 08/08/2014   MCV 92.6 08/08/2014   PLT 291 07/13/2013      Component Value Date/Time   NA 140 08/08/2014 1902   K 4.3 08/08/2014 1902   CL 101 08/08/2014 1902   CO2 29 08/08/2014 1902   GLUCOSE 84 08/08/2014 1902   BUN 11 08/08/2014 1902   CREATININE 1.07 08/08/2014 1902   CREATININE 0.9 10/06/2007 1134   CALCIUM 9.7 08/08/2014 1902   PROT 7.8 08/08/2014 1902   ALBUMIN 4.5 08/08/2014 1902   AST 37* 08/08/2014 1902   ALT 59* 08/08/2014 1902   ALKPHOS 63 08/08/2014 1902   BILITOT 0.6 08/08/2014 1902   GFRNONAA 80 08/08/2014 1902   GFRNONAA 97 10/06/2007 1134   GFRAA >89 08/08/2014 1902   GFRAA 118 10/06/2007 1134   Lab Results  Component Value Date   CHOL 190  08/08/2014   HDL 39* 08/08/2014   LDLCALC 126 08/08/2014   TRIG 126 08/08/2014   CHOLHDL 4.9 08/08/2014    Lab Results  Component Value Date   TSH 2.948 08/08/2014      ASSESSMENT AND PLAN 49 y.o. year old male  has a past medical history of Anxiety; OSA on CPAP; Rhinitis, allergic; Insomnia w/ sleep apnea; and Asthma. here with:  1. Obstructive sleep apnea on CPAP 2. Insomnia  The patient is still having trouble using his CPAP machine greater than 4 hours every night. I have encouraged the patient to try to use his machine for his long as possible each night. The patient also continues to have trouble with insomnia. I will increase his trazodone to 100 mg at bedtime. I have encouraged him to try to use this consistently to see if it offers him any benefit. If not we will wean the patient off of trazodone. Patient verbalized understanding. He will follow-up in 3-4 months with Dr. Mechele Claude, MSN, NP-C 11/05/2014, 8:35 AM [facility] [address] [place], [place] [address] [phone]

## 2015-02-04 ENCOUNTER — Encounter: Payer: Self-pay | Admitting: Neurology

## 2015-02-04 ENCOUNTER — Ambulatory Visit (INDEPENDENT_AMBULATORY_CARE_PROVIDER_SITE_OTHER): Payer: Managed Care, Other (non HMO) | Admitting: Neurology

## 2015-02-04 VITALS — BP 130/82 | HR 74 | Resp 20 | Ht 69.5 in | Wt 229.0 lb

## 2015-02-04 DIAGNOSIS — G4733 Obstructive sleep apnea (adult) (pediatric): Secondary | ICD-10-CM

## 2015-02-04 DIAGNOSIS — Z9989 Dependence on other enabling machines and devices: Secondary | ICD-10-CM

## 2015-02-04 NOTE — Patient Instructions (Signed)

## 2015-02-04 NOTE — Progress Notes (Signed)
PATIENT: Richard Reyes DOB: 02/03/1965  REASON FOR VISIT: follow up-obstructive sleep apnea, insomnia, obesity- 15 minutes of face to face in the setting of a 25 minute visit.  HISTORY FROM: patient  HISTORY OF PRESENT ILLNESS: Richard Reyes is a 50 year old male with a history of obstructive sleep apnea and insomnia. He returns today for a compliance download. His download indicates that he uses machine 28 out of 30 days for compliance of 93%, a 10 % increase.   He on average uses his machine 6hours and 2 minutes.  He uses machine greater than 5 hours on 23 out of 30 days for compliance of 78%.  The patient's AHI is 4.4 with a pressure of 8 cm of water and EPR of 1.  The patient's leak in the 95th percentile is 32.3 l/m. The patient reports that occasionally at night the mask will slide off as he moves around in bed. The patient states that he continues to have trouble with falling and staying asleep. He has tried trazodone but has not noticed a big difference.  However he is not using the trazodone consistently every night. He think about reducing the to 1/2 tablets of the trazodone and he feels that that may have been beneficial. No hang over effect . He does not want to try any medication that is addictive or any hallucinogenic or hypnotic medication. He still has nocturia. WE will discuss weight loss today with diet and exercise. 2 day fast.   HISTORY 07/02/14: Richard Reyes is a 50 year old male with a history of obstructive sleep apnea and insomnia. He returns today for a 30 day compliance download. His compliance download indicates that he uses machine 22 out of 30 days for a compliance of 73%. He uses the machine greater than 4 hours 11 out of 30 days for compliance of 37%. His average usage is 4 hours and 14 minutes. His AHI is 1.5 at 8 cm of water with EPR level at 1. The patient's leak at the 95th percentile is 28.6 L/Min. He reports that his mask and straps have not been replaced in  over a year. Epworth score is 5 and fatigue severity score is 18. He does notice that he will have a leak at times. The patient was started on Belsomra at the last visit. Patient states that he did not notice the benefit. He states that he doubled his medication but still did not see any benefit. Patient states that he still has a hard time falling asleep. Patient does endorse good sleep hygiene. He eliminates TV/phone/tablet 1 hour before bedtime. His room is cold and dark. He does not drink caffeine after 12 PM. In the past the patient has tried Ambien, [place] and Sonata without benefit. He returns today for evaluation.  HISTORY 03/22/14: Richard Reyes is a 50 year old male with history of obstructive sleep Apnea. Marland Kitchen He returns today for a 30 day compliance download. He brought his machine with him today and his reports shows an AHI of 5.0 at 8 cm of water, uses his machine for 3 hours and 42 minutes a night, with 47% compliance. He uses his machine greater than 4 hours only for 30 days with a compliance of 13%. Patient states that he has not been using his machine because he has been sleeping on the couch. He states that he feels like he sleeps better on the couch. His Epworth score is 3 points was previously 8 points. His fatigue severity score is 24  was previously 34.Patient reports that he gets about 8 hours of sleep a night. He goes to bed around 10:30PM and arises at 6:30AM. He continues to have a have a hard time staying asleep. He states that he will wake up multiple times during the night. He has tried Quarry manager, Ambien and Lunesta in the past without benefit. States that the gets up about 2-3 times a night to urinate. .Since the last visit the patient has had no new medical issues.     REVIEW OF SYSTEMS: Out of a complete 14 system review of symptoms, the patient complains only of the following symptoms, and all other reviewed systems are negative.  Epworth Sleepiness Scale score again at 5, fatigue  severity score remains at  12 points. Snoring.   ALLERGIES: Allergies  Allergen Reactions  . Sulfonamide Derivatives     HOME MEDICATIONS: Outpatient Prescriptions Prior to Visit  Medication Sig Dispense Refill  . PROAIR HFA 108 (90 BASE) MCG/ACT inhaler     . traZODone (DESYREL) 50 MG tablet Take 2 tablets (100 mg total) by mouth at bedtime. 60 tablet 3  . nystatin cream (MYCOSTATIN) Apply 1 application topically 2 (two) times daily. 30 g 0   No facility-administered medications prior to visit.    PAST MEDICAL HISTORY: Past Medical History  Diagnosis Date  . Anxiety   . OSA on CPAP   . Rhinitis, allergic   . Insomnia w/ sleep apnea   . Asthma     exercised induced    PAST SURGICAL HISTORY: Past Surgical History  Procedure Laterality Date  . Nose surgery    . Hemorrhoid surgery N/A 10/16/2013    Procedure: EXCISION OF EXTERNAL HEMORRHOIDS X 2 AND ANAL EXAM UNDER ANESTHESIA;  Surgeon: Gayland Curry, MD;  Location: [facility];  Service: General;  Laterality: N/A;    FAMILY HISTORY: Family History  Problem Relation Age of Onset  . Cancer Maternal Grandmother     SOCIAL HISTORY: Social History   Social History  . Marital Status: Single    Spouse Name: N/A  . Number of Children: 0  . Years of Education: 16   Occupational History  .  [employer]   Social History Main Topics  . Smoking status: Former Smoker    Quit date: 05/18/2007  . Smokeless tobacco: Never Used  . Alcohol Use: 0.0 oz/week    0 Standard drinks or equivalent per week     Comment: rarely  . Drug Use: No  . Sexual Activity: Not on file   Other Topics Concern  . Not on file   Social History Narrative   Patient is single and lives alone.   Patient has a college education.   Patient is working full time.   Patient is right-handed.   Patient does not drink any caffeine.      PHYSICAL EXAM  Filed Vitals:   02/04/15 0817  BP: 130/82  Pulse: 74  Resp: 20  Height:  5' 9.5" (1.765 m)  Weight: 229 lb (103.874 kg)   Body mass index is 33.34 kg/(m^2).  Generalized: Well developed, in no acute distress  Neck: Circumference 19 inches, Mallampati 3+  Neurological examination  Mentation: Alert oriented to time, place, history taking. Follows all commands speech and language fluent Cranial nerve II-XII: Pupils were equal round reactive to light. Extraocular movements were full, visual field were full on confrontational test. Facial sensation and strength were normal. Uvula tongue midline. Head turning and shoulder shrug  were normal and symmetric. Motor: The motor testing reveals 5 over 5 strength of all 4 extremities. Good symmetric motor tone is noted throughout.  Sensory: Sensory testing is intact to soft touch on all 4 extremities. No evidence of extinction is noted.  Coordination: Cerebellar testing reveals good finger-nose-finger and heel-to-shin bilaterally.  Gait and station: Gait is normal. Tandem gait is normal. Romberg is negative. No drift is seen.  Reflexes: Deep tendon reflexes are symmetric and normal bilaterally.   DIAGNOSTIC DATA (LABS, IMAGING, TESTING) - I reviewed patient records, labs, notes, testing and imaging myself where available.  Lab Results  Component Value Date   WBC 9.9 08/08/2014   HGB 15.4 08/08/2014   HCT 46.1 08/08/2014   MCV 92.6 08/08/2014   PLT 291 07/13/2013      Component Value Date/Time   NA 140 08/08/2014 1902   K 4.3 08/08/2014 1902   CL 101 08/08/2014 1902   CO2 29 08/08/2014 1902   GLUCOSE 84 08/08/2014 1902   BUN 11 08/08/2014 1902   CREATININE 1.07 08/08/2014 1902   CREATININE 0.9 10/06/2007 1134   CALCIUM 9.7 08/08/2014 1902   PROT 7.8 08/08/2014 1902   ALBUMIN 4.5 08/08/2014 1902   AST 37* 08/08/2014 1902   ALT 59* 08/08/2014 1902   ALKPHOS 63 08/08/2014 1902   BILITOT 0.6 08/08/2014 1902   GFRNONAA 80 08/08/2014 1902   GFRNONAA 97 10/06/2007 1134   GFRAA >89 08/08/2014 1902   GFRAA 118  10/06/2007 1134   Lab Results  Component Value Date   CHOL 190 08/08/2014   HDL 39* 08/08/2014   LDLCALC 126 08/08/2014   TRIG 126 08/08/2014   CHOLHDL 4.9 08/08/2014    Lab Results  Component Value Date   TSH 2.948 08/08/2014      ASSESSMENT AND PLAN 50 y.o. year old male  has a past medical history of Anxiety; OSA on CPAP; Rhinitis, allergic; Insomnia w/ sleep apnea; and Asthma. here with:  1. Obstructive sleep apnea on CPAP 2. Insomnia 3. Obesity  The patient is  having less trouble using his CPAP machine greater than 4 hours every night. Richard Reyes is very interested in not longer having to use the CPAP machine and be discussed alternative treatments here today. One possibility for him who is a habitual mouth breather and loud snorer with the use of a dental device rather than an ENT surgery. I explained that a dental device will moves the jaw forward and therefore creating space and the back but there is a risk of creating TMJ symptoms.  I have encouraged the patient to try to use his machine for his long as possible each night.  The patient also continues to have trouble with insomnia. I will decrease his trazodone from 100 mg to 50 mg at bedtime. I have encouraged him to try to use this consistently to see if it offers him any benefit.  If not , we will wean the patient off of trazodone. Patient verbalized understanding.  He is working on weight loss with a Physiological scientist, he has implemented portion control.  He will follow-up in 12 months with Dr. Stefan Church, MD  02/04/2015, 8:45 AM [facility] [address], [place], [place] [address] [phone]

## 2015-12-17 ENCOUNTER — Ambulatory Visit (INDEPENDENT_AMBULATORY_CARE_PROVIDER_SITE_OTHER): Payer: Managed Care, Other (non HMO) | Admitting: Family Medicine

## 2015-12-17 ENCOUNTER — Telehealth: Payer: Self-pay

## 2015-12-17 VITALS — BP 124/82 | HR 90 | Temp 97.8°F | Resp 17 | Ht 69.5 in | Wt 246.0 lb

## 2015-12-17 DIAGNOSIS — G4700 Insomnia, unspecified: Secondary | ICD-10-CM

## 2015-12-17 DIAGNOSIS — Z0000 Encounter for general adult medical examination without abnormal findings: Secondary | ICD-10-CM

## 2015-12-17 DIAGNOSIS — Z125 Encounter for screening for malignant neoplasm of prostate: Secondary | ICD-10-CM | POA: Diagnosis not present

## 2015-12-17 DIAGNOSIS — G4733 Obstructive sleep apnea (adult) (pediatric): Secondary | ICD-10-CM

## 2015-12-17 DIAGNOSIS — Z130 Encounter for screening for diseases of the blood and blood-forming organs and certain disorders involving the immune mechanism: Secondary | ICD-10-CM

## 2015-12-17 DIAGNOSIS — E6609 Other obesity due to excess calories: Secondary | ICD-10-CM | POA: Diagnosis not present

## 2015-12-17 DIAGNOSIS — L0591 Pilonidal cyst without abscess: Secondary | ICD-10-CM

## 2015-12-17 DIAGNOSIS — R740 Nonspecific elevation of levels of transaminase and lactic acid dehydrogenase [LDH]: Secondary | ICD-10-CM

## 2015-12-17 DIAGNOSIS — G4730 Sleep apnea, unspecified: Secondary | ICD-10-CM

## 2015-12-17 DIAGNOSIS — Z1211 Encounter for screening for malignant neoplasm of colon: Secondary | ICD-10-CM

## 2015-12-17 DIAGNOSIS — Z1389 Encounter for screening for other disorder: Secondary | ICD-10-CM

## 2015-12-17 DIAGNOSIS — J45909 Unspecified asthma, uncomplicated: Secondary | ICD-10-CM | POA: Insufficient documentation

## 2015-12-17 DIAGNOSIS — R350 Frequency of micturition: Secondary | ICD-10-CM

## 2015-12-17 DIAGNOSIS — IMO0001 Reserved for inherently not codable concepts without codable children: Secondary | ICD-10-CM

## 2015-12-17 DIAGNOSIS — Z1212 Encounter for screening for malignant neoplasm of rectum: Secondary | ICD-10-CM

## 2015-12-17 DIAGNOSIS — N411 Chronic prostatitis: Secondary | ICD-10-CM

## 2015-12-17 DIAGNOSIS — J4520 Mild intermittent asthma, uncomplicated: Secondary | ICD-10-CM

## 2015-12-17 DIAGNOSIS — Z113 Encounter for screening for infections with a predominantly sexual mode of transmission: Secondary | ICD-10-CM

## 2015-12-17 DIAGNOSIS — Z1329 Encounter for screening for other suspected endocrine disorder: Secondary | ICD-10-CM

## 2015-12-17 DIAGNOSIS — Z6835 Body mass index (BMI) 35.0-35.9, adult: Secondary | ICD-10-CM

## 2015-12-17 DIAGNOSIS — L988 Other specified disorders of the skin and subcutaneous tissue: Secondary | ICD-10-CM

## 2015-12-17 DIAGNOSIS — R3129 Other microscopic hematuria: Secondary | ICD-10-CM

## 2015-12-17 DIAGNOSIS — R7401 Elevation of levels of liver transaminase levels: Secondary | ICD-10-CM

## 2015-12-17 DIAGNOSIS — K760 Fatty (change of) liver, not elsewhere classified: Secondary | ICD-10-CM

## 2015-12-17 DIAGNOSIS — E291 Testicular hypofunction: Secondary | ICD-10-CM

## 2015-12-17 DIAGNOSIS — Z136 Encounter for screening for cardiovascular disorders: Secondary | ICD-10-CM | POA: Diagnosis not present

## 2015-12-17 DIAGNOSIS — Z9989 Dependence on other enabling machines and devices: Secondary | ICD-10-CM

## 2015-12-17 DIAGNOSIS — Z1383 Encounter for screening for respiratory disorder NEC: Secondary | ICD-10-CM

## 2015-12-17 LAB — POCT URINALYSIS DIP (MANUAL ENTRY)
Bilirubin, UA: NEGATIVE
Glucose, UA: NEGATIVE
Ketones, POC UA: NEGATIVE
Leukocytes, UA: NEGATIVE
Nitrite, UA: NEGATIVE
Spec Grav, UA: >=1.03
Urobilinogen, UA: 0.2
pH, UA: 5.5

## 2015-12-17 MED ORDER — CLOTRIMAZOLE-BETAMETHASONE 1-0.05 % EX CREA: 1.0000 "application " | TOPICAL_CREAM | Freq: Every day | CUTANEOUS | 0 refills | Status: DC

## 2015-12-17 NOTE — Telephone Encounter (Signed)
Severe OSA , no likely fully treated with a dental device. He can discuss with dr Ron Parker, please send referral.

## 2015-12-17 NOTE — Telephone Encounter (Signed)
According to patient's sleep study, his AHI was 47.3, does he even qualify for an OA?

## 2015-12-17 NOTE — Progress Notes (Signed)
Subjective:  By signing my name below, I, Richard Reyes, attest that this documentation has been prepared under the direction and in the presence of Delman Cheadle, MD Electronically Signed: Ladene Artist, ED Scribe 12/17/2015 at 8:48 AM.   Patient ID: Richard Reyes, male    DOB: 1965-12-06, 50 y.o.   MRN: [id number]   Chief Complaint  Patient presents with  . Annual Exam   HPI HPI Comments: Richard Reyes is a 50 y.o. male who presents to the [facility] for an annual exam. Has OSA, highly compliant with cpap over the past year. Trazodone did not make significant  improvement in sleep. He has failed Ambien and Belsomra and had significant trouble with Lunesta. Was advised to consider using a dental device. trazodone increased to 100 mg. Advised diet and exercise. Was working with a Physiological scientist.   Has seen urology prior for possible bph with elevated PSA and prostate biopsy. Treated with antibiotic. H/o hemorrhoids, managed by general surgery. Has not seen GI and has not had a screening colonoscopy. Sees dentist every 6 months. Rece[facility] care [facility]. 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 Tetanus 2015. Did have mildly elevated liver enzymes with labs last year. Current 10 year ASCVD rick 4.8%, lifetime 36%. Positive h/o fatty liver seen on [place] in 2009. Appointment with Dr. Brett Fairy scheduled.   Pt is fasting at this visit. Pt states that he suspects fatty liver was due to weight gain during that time, but he states that he has gotten a Physiological scientist again and has a gym in his home. Pt denies chest pain with exercising but has noticed arthritis in his ankles.   Sleep Pt states that he stopped all medications for sleep. States that he did not want to take anything for sleep any longer.   GU Pt also expresses concerns of low testosterone but states that he will follow-up with his urologist, Dr. Karsten Ro. He also reports urinary frequency and  incomplete emptying. Pt states that he gets up at least twice during the night to urinate. He denies urinary incontinence but reports that he thought that he was going to experience incontinence once while sneezing. He has also noticed an occasional "tearing" sensation with BMs.    STD Screening Pt requests STD screening at this visit although he states that he has not been sexually active lately.   Immunizations He had his flu vaccine last month at [facility].  Immunization History  Administered Date(s) Administered  . Hep A / Hep B 10/26/2007, 11/16/2007, 04/16/2008  . Influenza,inj,Quad PF,36+ Mos 09/15/2013  . Tdap 07/13/2013     Past Medical History:  Diagnosis Date  . Anxiety   . Asthma    exercised induced  . Insomnia w/ sleep apnea   . OSA on CPAP   . Rhinitis, allergic    Current Outpatient Prescriptions on File Prior to Visit  Medication Sig Dispense Refill  . PROAIR HFA 108 (90 BASE) MCG/ACT inhaler     . traZODone (DESYREL) 50 MG tablet Take 2 tablets (100 mg total) by mouth at bedtime. (Patient not taking: Reported on 12/17/2015) 60 tablet 3   No current facility-administered medications on file prior to visit.    Allergies  Allergen Reactions  . Sulfonamide Derivatives    Past Surgical History:  Procedure Laterality Date  . HEMORRHOID SURGERY N/A 10/16/2013   Procedure: EXCISION OF EXTERNAL HEMORRHOIDS X 2 AND ANAL EXAM UNDER ANESTHESIA;  Surgeon: Leighton Ruff  Redmond Pulling, MD;  Location: [facility];  Service: General;  Laterality: N/A;  . NOSE SURGERY     Family History  Problem Relation Age of Onset  . Cancer Maternal Grandmother    Social History   Social History  . Marital status: Single    Spouse name: N/A  . Number of children: 0  . Years of education: 62   Occupational History  .  [employer]   Social History Main Topics  . Smoking status: Former Smoker    Quit date: 05/18/2007  . Smokeless tobacco: Never Used  . Alcohol use 0.0  oz/week     Comment: rarely  . Drug use: No  . Sexual activity: Not Asked   Other Topics Concern  . None   Social History Narrative   Patient is single and lives alone.   Patient has a college education.   Patient is working full time.   Patient is right-handed.   Patient does not drink any caffeine.   Depression screen [facility] 2/9 12/17/2015 08/08/2014 06/23/2014  Decreased Interest 0 0 0  Down, Depressed, Hopeless 0 0 0  PHQ - 2 Score 0 0 0     Review of Systems  Genitourinary: Positive for frequency.  All other systems reviewed and are negative. see hpi    Objective:   Physical Exam  Constitutional: He is oriented to person, place, and time. He appears well-developed and well-nourished. No distress.  HENT:  Head: Normocephalic and atraumatic.  Right Ear: Tympanic membrane, external ear and ear canal normal.  Left Ear: Tympanic membrane, external ear and ear canal normal.  Nose: Nose normal.  Mouth/Throat: Uvula is midline, oropharynx is clear and moist and mucous membranes are normal. No oropharyngeal exudate.  Eyes: Conjunctivae and EOM are normal. Right eye exhibits no discharge. Left eye exhibits no discharge. No scleral icterus.  Neck: Normal range of motion. Neck supple. No tracheal deviation present. No thyromegaly present.  Cardiovascular: Normal rate, regular rhythm, S1 normal, S2 normal, normal heart sounds and intact distal pulses.   Pulmonary/Chest: Effort normal and breath sounds normal. No respiratory distress.  Abdominal: Soft. Bowel sounds are normal. He exhibits no distension and no mass. There is no tenderness. There is no rebound, no guarding and no tenderness at McBurney's point.  Positive diastasis recti  Genitourinary:  Genitourinary Comments: Chaperone present. Gluteal crease is not deep but is slightly macerated leading to abrasion of the midline skin and open wound. External recutm appears normal. No sign of external hemorrhoids or large fissures though  did not do a detailed exam due to pt discomfort. No internal hemorrhoids. Rectal tone decreased though having significant pain on exam. Prostate is normal size though quite boggy and some mild to moderate discomfort with palpation.   Musculoskeletal: Normal range of motion. He exhibits no edema.  Lymphadenopathy:    He has no cervical adenopathy.  Neurological: [name], place, and time. He has normal reflexes. No cranial nerve deficit. He exhibits normal muscle tone.  Skin: Skin is warm and dry. No rash noted. He is not diaphoretic. No erythema.  Psychiatric: He has a normal mood and affect. His behavior is normal.  Nursing note and vitals reviewed.  BP 124/82 (BP Location: Right Arm, Patient Position: Sitting, Cuff Size: Large)   Pulse 90   Temp 97.8 F (36.6 C) (Oral)   Resp 17   Ht 5' 9.5" (1.765 m)   Wt 246 lb (111.6 kg)  SpO2 95%   BMI 35.81 kg/m     Results for orders placed or performed in visit on 12/17/15  POCT urinalysis dipstick  Result Value Ref Range   Color, UA yellow yellow   Clarity, UA clear clear   Glucose, UA negative negative   Bilirubin, UA negative negative   Ketones, POC UA negative negative   Spec Grav, UA >=1.030    Blood, UA trace-lysed (A) negative   pH, UA 5.5    Protein Ur, POC trace (A) negative   Urobilinogen, UA 0.2    Nitrite, UA Negative Negative   Leukocytes, UA Negative Negative   Assessment & Plan:   1. Annual physical exam   2. Screening for cardiovascular, respiratory, and genitourinary diseases   3. Screening for colorectal cancer   4. Screening for deficiency anemia   5. Screening for prostate cancer   6. Screening for thyroid disorder   7. OSA on CPAP   8. Insomnia with sleep apnea   9. Mild intermittent asthma without complication   10. Class 2 obesity due to excess calories with serious comorbidity and body mass index (BMI) of 35.0 to 35.9 in adult   11. Microscopic hematuria   12. Transaminitis     13. Hepatic steatosis   14. Screening examination for sexually transmitted disease   15. Urinary frequency   16. Screening for STD (sexually transmitted disease)   10. Chronic prostatitis - refer to urology  18. Abnormal gluteal crease - pt keeps getting some maceration and irritation of his gluteal crease documented over the past years - I think this is likely just a variant of normal in likely his sweat pattern and body type so he is going to need to develop a baseline regimen of using a barrier ointment to protect or powder to keep dry. Try lotrisone to decrease acute inflammation.  27. Testosterone deficiency in male - pt would like treatment - refer to urology    Orders Placed This Encounter  Procedures  . GC/Chlamydia Probe Amp  . Urine culture    S/p 3 sec prostatic massage  . TSH  . CBC  . Comprehensive metabolic panel    Order Specific Question:   Has the patient fasted?    Answer:   Yes  . RPR  . HCV Ab w/Rflx to Verification  . HIV antibody  . Lipid panel    Order Specific Question:   Has the patient fasted?    Answer:   Yes  . TestT+TestF+SHBG  . Cologuard  . Interpretation:  . Ambulatory referral to Urology    Referral Priority:   Routine    Referral Type:   Consultation    Referral Reason:   Specialty Services Required    Referred to Provider:   Kathie Rhodes, MD    Requested Specialty:   Urology    Number of Visits Requested:   1  . POCT urinalysis dipstick  . POCT urinalysis dipstick    S/p 3-4 sec prostate massage  . POCT Microscopic Urinalysis [facility])    Scheduling Instructions:     S/p 3sec prostatic massage    Meds ordered this encounter  Medications  . clotrimazole-betamethasone (LOTRISONE) cream    Sig: Apply 1 application topically daily.    Dispense:  45 g    Refill:  0    I personally performed the services described in this documentation, which was scribed in my presence. The recorded information has been reviewed and considered, and  addended by  me as needed.   Delman Cheadle, M.D.  [facility] [address] [place], [place] [address] [phone] phone [phone] fax  01/07/16 8:11 AM

## 2015-12-17 NOTE — Telephone Encounter (Signed)
Pt has cpap , is there a dental device that could be made?  Pt number [id number]

## 2015-12-17 NOTE — Patient Instructions (Addendum)
You need colon cancer screening.  Let me know if you would like to proceed with a colonoscopy referral. I would be happy to put one in but I know at prior referrals to [facility] you have declined to schedule. Your other option is the home Cologaurd test. This may be the best way to proceed due to how busy you are and your change in insurance.    Make sure to follow up with Dr. Ma Rings ASAP as we will want his insights to your urinary frequency. It sounds to me like you have developed some chronic prostatitis and may need treatment with antibiotics for months in order to get any significant improvement. He would also be an excellent person to manage her low testosterone.  I will make sure to send him copies of all of your lab results.   You can apply the Lotrisone ointment to your gluteal crease at least once a day. If it is bothering you more you may increase it to twice a day. You are going to need to keep a barrier cream such as Desitin or my personal favorite Boudreaux's Butt paste on yourr gluteal crease at all times. This is going to be a chronic problem for you just because it is the way you are put together. There is nothing wrong with this other than that it is likely going to keep getting raw and irritated and this open skin will be very painful. An alternative way to keep it dry would to be to place a non-scented anti-persperant over the area or use a powder such as Gold Bond or cornstarch. Watch out for active ingredients as these may burn. However active ingredients of antifungals may be useful. Unfortunately this is unlikely something that will be permanently fixed. I suspect you are didn't continue to have problems so coming up with a good regimen that you can practice regularly and indefinitely should help this from flaring up so bad        IF you received an x-ray today, you will receive an invoice from [facility]. Please contact [facility] at [phone] with  questions or concerns regarding your invoice.   IF you received labwork today, you will receive an invoice from [employer]. Please contact [facility] at [phone] with questions or concerns regarding your invoice.   Our billing staff will not be able to assist you with questions regarding bills from these companies.  You will be contacted with the lab results as soon as they are available. The fastest way to get your results is to activate your My Chart account. Instructions are located on the last page of this paperwork. If you have not heard from [place] regarding the results in 2 weeks, please contact this office.      Prostatitis Prostatitis is swelling or inflammation of the prostate gland. The prostate is a walnut-sized gland that is involved in the production of semen. It is located below a man's bladder, in front of the rectum. There are four types of prostatitis:  Chronic nonbacterial prostatitis. This is the most common type of prostatitis. It may be associated with a viral infection or autoimmune disorder.  Acute bacterial prostatitis. This is the least common type of prostatitis. It starts quickly and is usually associated with a bladder infection, high fever, and shaking chills. It can occur at any age.  Chronic bacterial prostatitis. This type usually results from acute bacterial prostatitis that happens repeatedly (is recurrent) or has not been treated properly.  It can occur in men of any age but is most common among middle-aged men whose prostate has begun to get larger. The symptoms are not as severe as symptoms caused by acute bacterial prostatitis.  Prostatodynia or chronic pelvic pain syndrome (CPPS). This type is also called pelvic floor disorder. It is associated with increased muscular tone in the pelvis surrounding the prostate. What are the causes? Bacterial prostatitis is caused by infection from bacteria. Chronic nonbacterial prostatitis may be  caused by:  Urinary tract infections (UTIs).  Nerve damage.  A response by the body's disease-fighting system (autoimmune response).  Chemicals in the urine. The causes of the other types of prostatitis are usually not known. What are the signs or symptoms? Symptoms of this condition vary depending upon the type of prostatitis. If you have acute bacterial prostatitis, you may experience:  Urinary symptoms, such as:  Painful urination.  Burning during urination.  Frequent and sudden urges to urinate.  Inability to start urinating.  A weak or interrupted stream of urine.  Vomiting.  Nausea.  Fever.  Chills.  Inability to empty the bladder completely.  Pain in the:  Muscles or joints.  Lower back.  Lower abdomen. If you have any of the other types of prostatitis, you may experience:  Urinary symptoms, such as:  Sudden urges to urinate.  Frequent urination.  Difficulty starting urination.  Weak urine stream.  Dribbling after urination.  Discharge from the urethra. The urethra is a tube that opens at the end of the penis.  Pain in the:  Testicles.  Penis or tip of the penis.  Rectum.  Area in front of the rectum and below the scrotum (perineum).  Problems with sexual function.  Painful ejaculation.  Bloody semen. How is this diagnosed? This condition may be diagnosed based on:  A physical and medical exam.  Your symptoms.  A urine test to check for bacteria.  An exam in which a health care provider uses a finger to feel the prostate (digital rectal exam).  A test of a sample of semen.  Blood tests.  Ultrasound.  Removal of prostate tissue to be examined under a microscope (biopsy).  Tests to check how your body handles urine (urodynamic tests).  A test to look inside your bladder or urethra (cystoscopy). How is this treated? Treatment for this condition depends on the type of prostatitis. Treatment may involve:  Medicines to  relieve pain or inflammation.  Medicines to help relax your muscles.  Physical therapy.  Heat therapy.  Techniques to help you control certain body functions (biofeedback).  Relaxation exercises.  Antibiotic medicine, if your condition is caused by bacteria.  Warm water baths (sitz baths). Sitz baths help with relaxing your pelvic floor muscles, which helps to relieve pressure on the prostate. Follow these instructions at home:  Take over-the-counter and prescription medicines only as told by your health care provider.  If you were prescribed an antibiotic, take it as told by your health care provider. Do not stop taking the antibiotic even if you start to feel better.  If physical therapy, biofeedback, or relaxation exercises were prescribed, do exercises as instructed.  Take sitz baths as directed by your health care provider. For a sitz bath, sit in warm water that is deep enough to cover your hips and buttocks.  Keep all follow-up visits as told by your health care provider. This is important. Contact a health care provider if:  Your symptoms get worse.  You have a fever.  Get help right away if:  You have chills.  You feel nauseous.  You vomit.  You feel light-headed or feel like you are going to faint.  You are unable to urinate.  You have blood or blood clots in your urine. This information is not intended to replace advice given to you by your health care provider. Make sure you discuss any questions you have with your health care provider. Document Released: 12/20/1999 Document Revised: 09/12/2015 Document Reviewed: 09/12/2015 Elsevier Interactive Patient Education  2017 Elsevier Inc.   Fatty Liver Introduction Fatty liver, also called hepatic steatosis or steatohepatitis, is a condition in which too much fat has built up in your liver cells. The liver removes harmful substances from your bloodstream. It produces fluids your body needs. It also helps your  body use and store energy from the food you eat. In many cases, fatty liver does not cause symptoms or problems. It is often diagnosed when tests are being done for other reasons. However, over time, fatty liver can cause inflammation that may lead to more serious liver problems, such as scarring of the liver (cirrhosis). What are the causes? Causes of fatty liver may include:  Drinking too much alcohol.  Poor nutrition.  Obesity.  Cushing syndrome.  Diabetes.  Hyperlipidemia.  Pregnancy.  Certain drugs.  Poisons.  Some viral infections. What increases the risk? You may be more likely to develop fatty liver if you:  Abuse alcohol.  Are pregnant.  Are overweight.  Have diabetes.  Have hepatitis.  Have a high triglyceride level. What are the signs or symptoms? Fatty liver often does not cause any symptoms. In cases where symptoms develop, they can include:  Fatigue.  Weakness.  Weight loss.  Confusion.  Abdominal pain.  Yellowing of your skin and the white parts of your eyes (jaundice).  Nausea and vomiting. How is this diagnosed? Fatty liver may be diagnosed by:  Physical exam and medical history.  Blood tests.  Imaging tests, such as an ultrasound, CT scan, or MRI.  Liver biopsy. A small sample of liver tissue is removed using a needle. The sample is then looked at under a microscope. How is this treated? Fatty liver is often caused by other health conditions. Treatment for fatty liver may involve medicines and lifestyle changes to manage conditions such as:  Alcoholism.  High cholesterol.  Diabetes.  Being overweight or obese. Follow these instructions at home:  Eat a healthy diet as directed by your health care provider.  Exercise regularly. This can help you lose weight and control your cholesterol and diabetes. Talk to your health care provider about an exercise plan and which activities are best for you.  Do not drink  alcohol.  Take medicines only as directed by your health care provider. Contact a health care provider if: You have difficulty controlling your:  Blood sugar.  Cholesterol.  Alcohol consumption. Get help right away if:  You have abdominal pain.  You have jaundice.  You have nausea and vomiting. This information is not intended to replace advice given to you by your health care provider. Make sure you discuss any questions you have with your health care provider. Document Released: 02/06/2005 Document Revised: 05/30/2015 Document Reviewed: 05/03/2013  2017 Elsevier Nonalcoholic Fatty Liver Disease Diet Introduction Nonalcoholic fatty liver disease is a condition that causes fat to accumulate in and around the liver. The disease makes it harder for the liver to work the way that it should. Following a healthy diet can  help to keep nonalcoholic fatty liver disease under control. It can also help to prevent or improve conditions that are associated with the disease, such as heart disease, diabetes, high blood pressure, and abnormal cholesterol levels. Along with regular exercise, this diet:  Promotes weight loss.  Helps to control blood sugar levels.  Helps to improve the way that the body uses insulin. What do I need to know about this diet?  Use the glycemic index (GI) to plan your meals. The index tells you how quickly a food will raise your blood sugar. Choose low-GI foods. These foods take a longer time to raise blood sugar.  Keep track of how many calories you take in. Eating the right amount of calories will help you to achieve a healthy weight.  You may want to follow a Mediterranean diet. This diet includes a lot of vegetables, lean meats or fish, whole grains, fruits, and healthy oils and fats. What foods can I eat? Grains  Whole grains, such as whole-wheat or whole-grain breads, crackers, tortillas, cereals, and pasta. Stone-ground whole wheat. Pumpernickel bread.  Unsweetened oatmeal. Bulgur. Barley. Quinoa. Brown or wild rice. Corn or whole-wheat flour tortillas. Vegetables  Lettuce. Spinach. Peas. Beets. Cauliflower. Cabbage. Broccoli. Carrots. Tomatoes. Squash. Eggplant. Herbs. Peppers. Onions. Cucumbers. Brussels sprouts. Yams and sweet potatoes. Beans. Lentils. Fruits  Bananas. Apples. Oranges. Grapes. Papaya. Mango. Pomegranate. Kiwi. Grapefruit. Cherries. Meats and Other Protein Sources  Seafood and shellfish. Lean meats. Poultry. Tofu. Dairy  Low-fat or fat-free dairy products, such as yogurt, cottage cheese, and cheese. Beverages  Water. Sugar-free drinks. Tea. Coffee. Low-fat or skim milk. Milk alternatives, such as soy or almond milk. Real fruit juice. Condiments  Mustard. Relish. Low-fat, low-sugar ketchup and barbecue sauce. Low-fat or fat-free mayonnaise. Sweets and Desserts  Sugar-free sweets. Fats and Oils  Avocado. Canola or olive oil. Nuts and nut butters. Seeds. The items listed above may not be a complete list of recommended foods or beverages. Contact your dietitian for more options.  What foods are not recommended? Palm oil and coconut oil. Processed foods. Fried foods. Sweetened drinks, such as sweet tea, milkshakes, snow cones, iced sweet drinks, and sodas. Alcohol. Sweets. Foods that contain a lot of salt or sodium. The items listed above may not be a complete list of foods and beverages to avoid. Contact your dietitian for more information.  This information is not intended to replace advice given to you by your health care provider. Make sure you discuss any questions you have with your health care provider. Document Released: 05/08/2014 Document Revised: 05/30/2015 Document Reviewed: 01/16/2014  2017 Elsevier

## 2015-12-18 LAB — COMPREHENSIVE METABOLIC PANEL
ALT: 95 IU/L — ABNORMAL HIGH (ref 0–44)
AST: 66 IU/L — ABNORMAL HIGH (ref 0–40)
Albumin/Globulin Ratio: 1.3 (ref 1.2–2.2)
Albumin: 4.3 g/dL (ref 3.5–5.5)
Alkaline Phosphatase: 74 IU/L (ref 39–117)
BUN/Creatinine Ratio: 10 (ref 9–20)
BUN: 10 mg/dL (ref 6–24)
Bilirubin Total: 0.6 mg/dL (ref 0.0–1.2)
CO2: 23 mmol/L (ref 18–29)
Calcium: 9.3 mg/dL (ref 8.7–10.2)
Chloride: 97 mmol/L (ref 96–106)
Creatinine, Ser: 0.99 mg/dL (ref 0.76–1.27)
GFR calc Af Amer: 101 mL/min/{1.73_m2} (ref >59)
GFR calc non Af Amer: 87 mL/min/{1.73_m2} (ref >59)
Globulin, Total: 3.3 g/dL (ref 1.5–4.5)
Glucose: 115 mg/dL — ABNORMAL HIGH (ref 65–99)
Potassium: 4.4 mmol/L (ref 3.5–5.2)
Sodium: 139 mmol/L (ref 134–144)
Total Protein: 7.6 g/dL (ref 6.0–8.5)

## 2015-12-18 LAB — HCV INTERPRETATION

## 2015-12-18 LAB — CBC
Hematocrit: 46.1 % (ref 37.5–51.0)
Hemoglobin: 16 g/dL (ref 13.0–17.7)
MCH: 32.3 pg (ref 26.6–33.0)
MCHC: 34.7 g/dL (ref 31.5–35.7)
MCV: 93 fL (ref 79–97)
Platelets: 282 10*3/uL (ref 150–379)
RBC: 4.95 x10E6/uL (ref 4.14–5.80)
RDW: 13.5 % (ref 12.3–15.4)
WBC: 7.4 10*3/uL (ref 3.4–10.8)

## 2015-12-18 LAB — LIPID PANEL
Chol/HDL Ratio: 5.4 ratio units — ABNORMAL HIGH (ref 0.0–5.0)
Cholesterol, Total: 207 mg/dL — ABNORMAL HIGH (ref 100–199)
HDL: 38 mg/dL — ABNORMAL LOW (ref >39)
LDL Calculated: 137 mg/dL — ABNORMAL HIGH (ref 0–99)
Triglycerides: 159 mg/dL — ABNORMAL HIGH (ref 0–149)
VLDL Cholesterol Cal: 32 mg/dL (ref 5–40)

## 2015-12-18 LAB — URINE CULTURE: Organism ID, Bacteria: NO GROWTH

## 2015-12-18 LAB — RPR: RPR Ser Ql: NONREACTIVE

## 2015-12-18 LAB — HIV ANTIBODY (ROUTINE TESTING W REFLEX): HIV Screen 4th Generation wRfx: NONREACTIVE

## 2015-12-18 LAB — TSH: TSH: 2 u[IU]/mL (ref 0.450–4.500)

## 2015-12-18 LAB — HCV AB W/RFLX TO VERIFICATION: HCV Ab: <0.1 s/co ratio (ref 0.0–0.9)

## 2015-12-18 NOTE — Telephone Encounter (Signed)
I spoke to patient and he is aware of recommendations below. He would like to proceed with referral.

## 2015-12-19 LAB — GC/CHLAMYDIA PROBE AMP
Chlamydia trachomatis, NAA: NEGATIVE
Neisseria gonorrhoeae by PCR: NEGATIVE

## 2015-12-21 LAB — TESTT+TESTF+SHBG
Sex Hormone Binding: 38.7 nmol/L (ref 19.3–76.4)
Testosterone, Free: 13 pg/mL (ref 7.2–24.0)
Testosterone, total: 484.1 ng/dL (ref 264.0–916.0)

## 2016-02-03 ENCOUNTER — Ambulatory Visit: Payer: Managed Care, Other (non HMO) | Admitting: Neurology

## 2016-02-20 ENCOUNTER — Encounter: Payer: Self-pay | Admitting: Family Medicine

## 2016-02-20 ENCOUNTER — Ambulatory Visit (INDEPENDENT_AMBULATORY_CARE_PROVIDER_SITE_OTHER): Payer: Managed Care, Other (non HMO) | Admitting: Family Medicine

## 2016-02-20 ENCOUNTER — Telehealth: Payer: Self-pay | Admitting: Family Medicine

## 2016-02-20 VITALS — BP 142/88 | HR 96 | Temp 98.0°F | Resp 18 | Ht 69.5 in | Wt 249.0 lb

## 2016-02-20 DIAGNOSIS — K760 Fatty (change of) liver, not elsewhere classified: Secondary | ICD-10-CM | POA: Diagnosis not present

## 2016-02-20 DIAGNOSIS — Z6836 Body mass index (BMI) 36.0-36.9, adult: Secondary | ICD-10-CM

## 2016-02-20 DIAGNOSIS — J029 Acute pharyngitis, unspecified: Secondary | ICD-10-CM

## 2016-02-20 DIAGNOSIS — E6609 Other obesity due to excess calories: Secondary | ICD-10-CM | POA: Diagnosis not present

## 2016-02-20 LAB — POCT RAPID STREP A (OFFICE): Rapid Strep A Screen: NEGATIVE

## 2016-02-20 MED ORDER — AMOXICILLIN 875 MG PO TABS: 875.0000 mg | ORAL_TABLET | Freq: Two times a day (BID) | ORAL | 0 refills | Status: DC

## 2016-02-20 NOTE — Progress Notes (Signed)
Subjective:    Patient ID: Richard Reyes, male    DOB: 1965-03-14, 51 y.o.   MRN: [id number] Chief Complaint  Patient presents with  . Sore Throat    GO OVER LAB RESULTS   HPI  [name] is a delightful 51 year old male who reports that today he developed rapidly progressing the pharyngitis and now his mouth tastes like metal. He is starting to feel diaphoretic. He was exposed to someone with the flu and strep today as well as exposed to several cases of strep pharyngitis last week. He did get his flu shot this year. Has some minimal rhinitis and did note some tender enlarged left cervical nodes yesterday. No fevers, no sinus pressure or pain. No significant myalgias or cough. Bowels normal.  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 Tetanus 2015. Testosterone level at his CPE 2 months prior was normal.  He follows with  Urology Dr. Karsten Ro Who confirmed that patient does not have low testosterone. He did complete a course of Cipro for chronic prostatitis.  Did have mildly elevated liver enzymes with labs last year which doubled on his LFTs 2 months prior. Current 10 year ASCVD rick 4.8%, lifetime 36% which increased to 5.5% with lifetime risk of 46% with his last lipid panel. Positive h/o fatty liver seen on [place] in 2009. Patient WAS fasting at the time of his last lipid panel 2 months prior. Has gained 3 lbs since his CPE 3 mos prior. He is getting a gym in his home and is working with a Physiological scientist. so is hoping to start noting improvement soon.  For a couple weeks now he has been focusing on a very high protein diet and increasing aerobic exercise though currently cannot weight lift. due to a right wrist injury from lifting a water bottle at work.  Has not done Cologuard - states he does not know anything about this though he would like to do so and he has not received anything in the mail.   Past Medical History:  Diagnosis Date  . Anxiety   . Asthma    exercised induced  .  Insomnia w/ sleep apnea   . OSA on CPAP   . Rhinitis, allergic    Past Surgical History:  Procedure Laterality Date  . HEMORRHOID SURGERY N/A 10/16/2013   Procedure: EXCISION OF EXTERNAL HEMORRHOIDS X 2 AND ANAL EXAM UNDER ANESTHESIA;  Surgeon: Gayland Curry, MD;  Location: [facility];  Service: General;  Laterality: N/A;  . NOSE SURGERY     Current Outpatient Prescriptions on File Prior to Visit  Medication Sig Dispense Refill  . clotrimazole-betamethasone (LOTRISONE) cream Apply 1 application topically daily. 45 g 0  . PROAIR HFA 108 (90 BASE) MCG/ACT inhaler      No current facility-administered medications on file prior to visit.    Allergies  Allergen Reactions  . Sulfonamide Derivatives    Family History  Problem Relation Age of Onset  . Cancer Maternal Grandmother    Social History   Social History  . Marital status: Single    Spouse name: N/A  . Number of children: 0  . Years of education: 110   Occupational History  .  [employer]   Social History Main Topics  . Smoking status: Former Smoker    Quit date: 05/18/2007  . Smokeless tobacco: Never Used  . Alcohol use 0.0 oz/week     Comment: rarely  . Drug use: No  . Sexual activity:  Not Asked   Other Topics Concern  . None   Social History Narrative   Patient is single and lives alone.   Patient has a college education.   Patient is working full time.   Patient is right-handed.   Patient does not drink any caffeine.    Review of Systems  Constitutional: Positive for activity change (increase), chills, diaphoresis and fatigue. Negative for appetite change, fever and unexpected weight change.  HENT: Positive for rhinorrhea, sore throat and trouble swallowing. Negative for congestion, ear pain, mouth sores, postnasal drip, sinus pressure and voice change.   Respiratory: Negative for cough and shortness of breath.   Cardiovascular: Negative for chest pain.  Gastrointestinal: Negative for  abdominal pain, nausea and vomiting.  Genitourinary: Negative for dysuria.  Musculoskeletal: Negative for arthralgias, myalgias, neck pain and neck stiffness.  Skin: Negative for rash.  Neurological: Negative for syncope.  Hematological: Positive for adenopathy.  Psychiatric/Behavioral: Positive for sleep disturbance (chronic).       Objective:   Physical Exam  Constitutional: He is oriented to person, place, and time. He appears well-developed and well-nourished. No distress.  HENT:  Head: Normocephalic and atraumatic.  Right Ear: Tympanic membrane, external ear and ear canal normal.  Left Ear: Tympanic membrane, external ear and ear canal normal.  Nose: Nose normal.  Mouth/Throat: Uvula is midline and mucous membranes are normal. Oropharyngeal exudate, posterior oropharyngeal edema and posterior oropharyngeal erythema present.  Eyes: Conjunctivae are normal. No scleral icterus.  Neck: Normal range of motion. Neck supple. No thyromegaly present.  Cardiovascular: Normal rate, regular rhythm, normal heart sounds and intact distal pulses.   Pulmonary/Chest: Effort normal and breath sounds normal. No respiratory distress.  Musculoskeletal: He exhibits no edema.  Lymphadenopathy:       Head (right side): Tonsillar adenopathy present. No submandibular, no preauricular and no posterior auricular adenopathy present.       Head (left side): Tonsillar and preauricular adenopathy present. No submandibular adenopathy present.    He has cervical adenopathy.       Right cervical: Superficial cervical adenopathy present.       Left cervical: Superficial cervical adenopathy present.       Right: No supraclavicular adenopathy present.       Left: No supraclavicular adenopathy present.  Lt>Rt  Neurological: [name], place, and time.  Skin: Skin is warm and dry. He is not diaphoretic. No erythema.  Psychiatric: He has a normal mood and affect. His behavior is normal.     BP (!) 142/88 (BP Location: Right Arm, Patient Position: Sitting, Cuff Size: Large)   Pulse 96   Temp 98 F (36.7 C) (Oral)   Resp 18   Ht 5' 9.5" (1.765 m)   Wt 249 lb (112.9 kg)   SpO2 99%   BMI 36.24 kg/m      Results for orders placed or performed in visit on 02/20/16  POCT rapid strep A  Result Value Ref Range   Rapid Strep A Screen Negative Negative   Assessment & Plan:   1. Acute pharyngitis, unspecified etiology   2. Fatty liver   3. Class 2 obesity due to excess calories without serious comorbidity with body mass index (BMI) of 36.0 to 36.9 in adult      Orders Placed This Encounter  Procedures  . Culture, Group A Strep    Order Specific Question:   Source    Answer:   oropharynx  . POCT rapid strep A  Meds ordered this encounter  Medications  . amoxicillin (AMOXIL) 875 MG tablet    Sig: Take 1 tablet (875 mg total) by mouth 2 (two) times daily.    Dispense:  20 tablet    Refill:  0    Delman Cheadle, M.D.  Primary Care at [facility] [address] [place], [place] [address] [phone] phone [phone] fax  02/20/16 6:46 PM

## 2016-02-20 NOTE — Patient Instructions (Addendum)
     IF you received an x-ray today, you will receive an invoice from [facility]. Please contact [facility] at [phone] with questions or concerns regarding your invoice.   IF you received labwork today, you will receive an invoice from [facility]. Please contact [facility] at [phone] with questions or concerns regarding your invoice.   Our billing staff will not be able to assist you with questions regarding bills from these companies.  You will be contacted with the lab results as soon as they are available. The fastest way to get your results is to activate your My Chart account. Instructions are located on the last page of this paperwork. If you have not heard from us regarding the results in 2 weeks, please contact this office.    Pharyngitis Pharyngitis is redness, pain, and swelling (inflammation) of your pharynx. What are the causes? Pharyngitis is usually caused by infection. Most of the time, these infections are from viruses (viral) and are part of a cold. However, sometimes pharyngitis is caused by bacteria (bacterial). Pharyngitis can also be caused by allergies. Viral pharyngitis may be spread from person to person by coughing, sneezing, and personal items or utensils (cups, forks, spoons, toothbrushes). Bacterial pharyngitis may be spread from person to person by more intimate contact, such as kissing. What are the signs or symptoms? Symptoms of pharyngitis include:  Sore throat.  Tiredness (fatigue).  Low-grade fever.  Headache.  Joint pain and muscle aches.  Skin rashes.  Swollen lymph nodes.  Plaque-like film on throat or tonsils (often seen with bacterial pharyngitis).  How is this diagnosed? Your health care provider will ask you questions about your illness and your symptoms. Your medical history, along with a physical exam, is often all that is needed to diagnose pharyngitis. Sometimes, a rapid strep test is done. Other lab tests may  also be done, depending on the suspected cause. How is this treated? Viral pharyngitis will usually get better in 3-4 days without the use of medicine. Bacterial pharyngitis is treated with medicines that kill germs (antibiotics). Follow these instructions at home:  Drink enough water and fluids to keep your urine clear or pale yellow.  Only take over-the-counter or prescription medicines as directed by your health care provider: ? If you are prescribed antibiotics, make sure you finish them even if you start to feel better. ? Do not take aspirin.  Get lots of rest.  Gargle with 8 oz of salt water ( tsp of salt per 1 qt of water) as often as every 1-2 hours to soothe your throat.  Throat lozenges (if you are not at risk for choking) or sprays may be used to soothe your throat. Contact a health care provider if:  You have large, tender lumps in your neck.  You have a rash.  You cough up green, yellow-brown, or bloody spit. Get help right away if:  Your neck becomes stiff.  You drool or are unable to swallow liquids.  You vomit or are unable to keep medicines or liquids down.  You have severe pain that does not go away with the use of recommended medicines.  You have trouble breathing (not caused by a stuffy nose). This information is not intended to replace advice given to you by your health care provider. Make sure you discuss any questions you have with your health care provider. Document Released: 12/22/2004 Document Revised: 05/30/2015 Document Reviewed: 08/29/2012 Elsevier Interactive Patient Education  2017 Elsevier Inc.  

## 2016-02-20 NOTE — Telephone Encounter (Signed)
Pt states he never received the cologard in the mail though it was ordered at his CPE in Dec. Please follow-up with the lab and see if we sent in the order or what has happened. Thanks.

## 2016-02-21 NOTE — Telephone Encounter (Signed)
Do you send this?

## 2016-02-23 LAB — CULTURE, GROUP A STREP

## 2016-05-09 ENCOUNTER — Encounter: Payer: Self-pay | Admitting: Physician Assistant

## 2016-05-09 ENCOUNTER — Ambulatory Visit (INDEPENDENT_AMBULATORY_CARE_PROVIDER_SITE_OTHER): Payer: 59 | Admitting: Family Medicine

## 2016-05-09 MED ORDER — ALBUTEROL SULFATE HFA 108 (90 BASE) MCG/ACT IN AERS: 2.0000 | INHALATION_SPRAY | RESPIRATORY_TRACT | 1 refills | Status: DC | PRN

## 2016-05-09 NOTE — Patient Instructions (Signed)
     IF you received an x-ray today, you will receive an invoice from [facility]. Please contact [facility] at [phone] with questions or concerns regarding your invoice.   IF you received labwork today, you will receive an invoice from [facility]. Please contact [facility] at [phone] with questions or concerns regarding your invoice.   Our billing staff will not be able to assist you with questions regarding bills from these companies.  You will be contacted with the lab results as soon as they are available. The fastest way to get your results is to activate your My Chart account. Instructions are located on the last page of this paperwork. If you have not heard from us regarding the results in 2 weeks, please contact this office.     

## 2016-05-09 NOTE — Progress Notes (Signed)
Pt was in today for  Chief Complaint  Patient presents with  . Skin Tag  . Medication Refill    wants refill on inhaler    He was scheduled to see Ivar Drape who got pulled for an emergency. Pt stated he would see first avail provider so was transferred to [facility] but then pt stated he only wanted to see myself PCP Dr. Brigitte Pulse.  Pt then got dressed and voluntarily left stating he would be willing to reschedule.  I refilled his inhaler but did not see this pt today.  BP 130/80   Pulse 88   Temp 98.3 F (36.8 C)   Resp 18   Ht 5\' 10"  (1.778 m)   Wt 245 lb (111.1 kg)   BMI 35.15 kg/m

## 2016-05-14 ENCOUNTER — Ambulatory Visit (INDEPENDENT_AMBULATORY_CARE_PROVIDER_SITE_OTHER): Payer: 59 | Admitting: Family Medicine

## 2016-05-14 ENCOUNTER — Encounter: Payer: Self-pay | Admitting: Family Medicine

## 2016-05-14 ENCOUNTER — Telehealth: Payer: Self-pay | Admitting: Family Medicine

## 2016-05-14 VITALS — BP 127/81 | HR 91 | Temp 98.1°F | Resp 18 | Ht 70.0 in | Wt 246.6 lb

## 2016-05-14 DIAGNOSIS — L918 Other hypertrophic disorders of the skin: Secondary | ICD-10-CM | POA: Diagnosis not present

## 2016-05-14 MED ORDER — LIDOCAINE-PRILOCAINE 2.5-2.5 % EX CREA: 1.0000 "application " | TOPICAL_CREAM | CUTANEOUS | 2 refills | Status: AC | PRN

## 2016-05-14 NOTE — Patient Instructions (Addendum)
IF you received an x-ray today, you will receive an invoice from [facility]. Please contact [facility] at [phone] with questions or concerns regarding your invoice.   IF you received labwork today, you will receive an invoice from [facility]. Please contact [facility] at [phone] with questions or concerns regarding your invoice.   Our billing staff will not be able to assist you with questions regarding bills from these companies.  You will be contacted with the lab results as soon as they are available. The fastest way to get your results is to activate your My Chart account. Instructions are located on the last page of this paperwork. If you have not heard from [place] regarding the results in 2 weeks, please contact this office.      Cryosurgery for Skin Conditions Cryosurgery, also called cryotherapy, is the use of extreme cold to freeze and remove abnormal or diseased tissue. Growths on the skin such as warts, precancerous skin lesions (actinic keratoses), and some skin cancers may be removed with cryosurgery. Cryosurgery usually takes a few minutes, and it can be done in your health care provider's office. Tell a health care provider about:  Any allergies you have.  All medicines you are taking, including vitamins, herbs, eye drops, creams, and over-the-counter medicines.  Any problems you or family members have had with anesthetic medicines.  Any blood disorders you have.  Any surgeries you have had.  Any medical conditions you have. What are the risks? Generally, this is a safe procedure. However, problems may occur, including:  Bleeding.  Scarring.  Changes in skin color (lighter or darker than normal skin tone).  Swelling.  Hair loss in the treated area.  Nerve damage and loss of feeling (rare). What happens before the procedure? No specific preparation is necessary for this procedure. What happens during the procedure?  Your  procedure will be performed using one of the following methods:  Your health care provider may apply a device (probe) to the skin. The probe has liquid nitrogen flowing through it to cool it down. The probe will be applied to the skin until the skin is frozen and destroyed.  Your health care provider may apply liquid nitrogen to the skin with a swab or by spraying it until the skin is frozen and destroyed.  The treated area may be covered with a bandage (dressing). These procedures may vary among health care providers and clinics. What happens after the procedure?  Shortly after the procedure, the treated area will become red and swollen. A blister may form. Summary  Cryosurgery, also called cryotherapy, is the use of extreme cold to freeze and remove abnormal or diseased tissue.  Cryosurgery usually takes a few minutes, and it can be done in your health care provider's office.  There are two different methods for performing cryosurgery. One method involves using a probe to freeze the growth, and the other method involves applying liquid nitrogen directly to the growth. This information is not intended to replace advice given to you by your health care provider. Make sure you discuss any questions you have with your health care provider. Document Released: 12/20/1999 Document Revised: 11/11/2015 Document Reviewed: 11/11/2015 Elsevier Interactive Patient Education  2017 [employer].  Cryosurgery for Skin Conditions, Care After This sheet gives you information about how to care for yourself after your procedure. Your health care provider may also give you more specific instructions. If you have problems or questions, contact your health care provider. What can  I expect after the procedure? After your procedure, it is common to have redness, swelling, and a blister that forms over the treated area. The blister may contain a small amount of blood. After about 2 weeks, the blister will break  on its own, leaving a scab. Then the treated area will heal. After healing, there is usually little or no scarring. Follow these instructions at home: Caring for the treated area   Follow instructions from your health care provider about how to take care of the treated area. Make sure you:  Keep the area covered with a bandage (dressing) until it heals, or for as long as told by your health care provider.  Wash your hands with soap and water before you change your dressing. If soap and water are not available, use hand sanitizer.  Change your dressing as told by your health care provider.  Keep the dressing and the treated area clean and dry. If the dressing gets wet, change it right away.  Clean the treated area with soap and water.  Check the treated area every day for signs of infection. Check for:  More redness, swelling, or pain.  More fluid or blood.  Warmth.  Pus or a bad smell. General instructions   Do not pick at your blister or try to break it open. This can cause infection and scarring.  Do not apply any medicine, cream, or lotion to the treated area unless directed by your health care provider.  Take over-the-counter and prescription medicines only as told by your health care provider.  Keep all follow-up visits as told by your health care provider. This is important. Contact a health care provider if:  You have more redness, swelling, or pain around the treated area.  You have more fluid or blood coming from the treated area.  The treated area feels warm to the touch.  You have pus or a bad smell coming from the treated area.  Your blister becomes large and painful. Get help right away if:  You have a fever and have redness spreading from the treated area. Summary  The treated area will become red and swollen shortly after the procedure.  You should keep the treated area and your dressing clean and dry.  Check the treated area every day for signs of  infection, such as fluid, pus, warmth, or having more redness, swelling, or pain.  Do not pick at your blister or try to break it open. This information is not intended to replace advice given to you by your health care provider. Make sure you discuss any questions you have with your health care provider. Document Released: 07/11/2004 Document Revised: 11/11/2015 Document Reviewed: 11/11/2015 Elsevier Interactive Patient Education  2017 [employer].

## 2016-05-14 NOTE — Progress Notes (Signed)
Subjective:    Patient ID: Richard Reyes, male    DOB: 1965/03/15, 51 y.o.   MRN: [id number] Chief Complaint  Patient presents with  . SKIN TAG    patient is here for skin tag removal; states he has a few to be removed    HPI  [name] is a delightful 51 year old male who is here for removal of a great number of small skin tags that have developed in his axilla and neck. He has had this done prior and tolerated it well. States that he prefers laser treatment rather than cutting but has tolerating cryosurg prior as well.  The tags are a little painful - the smaller ones hurt more, rub again his shirt line and get irritated.  Saw derm within the last year.  Did note a rough patch on his right forehead for sev mos which appeared to poss be benign mole vs milia vs ak so proposed we lightly treat it with cryo but forgot.  Has a gym in his house that he is starting.  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  Past Medical History:  Diagnosis Date  . Anxiety   . Asthma    exercised induced  . Insomnia w/ sleep apnea   . OSA on CPAP   . Rhinitis, allergic    Past Surgical History:  Procedure Laterality Date  . HEMORRHOID SURGERY N/A 10/16/2013   Procedure: EXCISION OF EXTERNAL HEMORRHOIDS X 2 AND ANAL EXAM UNDER ANESTHESIA;  Surgeon: Gayland Curry, MD;  Location: [facility];  Service: General;  Laterality: N/A;  . NOSE SURGERY     Current Outpatient Prescriptions on File Prior to Visit  Medication Sig Dispense Refill  . albuterol (PROAIR HFA) 108 (90 Base) MCG/ACT inhaler Inhale 2 puffs into the lungs every 4 (four) hours as needed for wheezing or shortness of breath. 1 Inhaler 1   No current facility-administered medications on file prior to visit.    Allergies  Allergen Reactions  . Sulfonamide Derivatives    Family History  Problem Relation Age of Onset  . Cancer Maternal Grandmother     Social History   Social History  . Marital status: Single    Spouse name: N/A  . Number of children: 0  . Years of education: 75   Occupational History  .  [employer]   Social History Main Topics  . Smoking status: Former Smoker    Quit date: 05/18/2007  . Smokeless tobacco: Never Used  . Alcohol use 0.0 oz/week     Comment: rarely  . Drug use: No  . Sexual activity: Not Asked   Other Topics Concern  . None   Social History Narrative   Patient is single and lives alone.   Patient has a college education.   Patient is working full time.   Patient is right-handed.   Patient does not drink any caffeine.   Depression screen [facility] 2/9 05/14/2016 05/09/2016 02/20/2016 12/17/2015 08/08/2014  Decreased Interest 0 0 0 0 0  Down, Depressed, Hopeless 0 0 0 0 0  PHQ - 2 Score 0 0 0 0 0    Review of Systems See hpi    Objective:   Physical Exam  Constitutional: He is oriented to person, place, and time. He appears well-developed and well-nourished. No distress.  HENT:  Head: Normocephalic and atraumatic.  Eyes: No scleral icterus.  Pulmonary/Chest: Effort normal.  Neurological: [name], place, and time.  Skin: Skin is warm and dry. Lesion noted. He is not diaphoretic.  numerous 1-2 mm thin fleshy tags- prob 10 in left axilla, 1 in right axilla, and 3-4 around posterior neck. 3 mm smooth circular skin colored nodule in mid right forehead.   Psychiatric: He has a normal mood and affect. His behavior is normal.      BP 127/81   Pulse 91   Temp 98.1 F (36.7 C) (Oral)   Resp 18   Ht '5\' 10"'  (1.778 m)   Wt 246 lb 9.6 oz (111.9 kg)   SpO2 95%   BMI 35.38 kg/m   Assessment & Plan:  Pt still never received cologuard kit in mail - ordered during CPE 12/17/15 then telephone call 2/15 asking that this be looked into and company contact but no info on phone call as to if this was done. Will ask again.  1. Skin tags, multiple acquired   Treated with  cryo today by dipping forceps in liquid nitro then pinching tag as there were numerous 1-2 mm thin fleshy tags- prob 10 in left axilla, 1 in right axilla, and 3-4 around posterior neck. Pt tolerated very well w/o comp. If any were not adequately frozen and do not fall off - can use below prior to return visit then will retreat or snip off (though pt did bleed easily and continued for some time when I tried this and he is intolerant of our bandaids so the dressing after snipping these tiny things would be rather time intensive.  Meds ordered this encounter  Medications  . lidocaine-prilocaine (EMLA) cream    Sig: Apply 1 application topically as needed. 30 to 60 minutes prior to doctor visit    Dispense:  30 g    Refill:  2    Please hold on file until needed.    Delman Cheadle, M.D.  Primary Care at [facility] [address] [place], [place] [address] [phone] phone [phone] fax  05/14/16 5:37 PM

## 2016-05-14 NOTE — Telephone Encounter (Signed)
See telephone message from 02/20/2016. Patient has still not received his Cologard kit to complete from CPE done 12/17/15. Please check with the company and see why not and what we need to do. Thanks.

## 2016-05-15 NOTE — Telephone Encounter (Signed)
Sent to lab to follow up

## 2016-05-22 ENCOUNTER — Ambulatory Visit (INDEPENDENT_AMBULATORY_CARE_PROVIDER_SITE_OTHER): Payer: 59 | Admitting: Family Medicine

## 2016-05-22 ENCOUNTER — Encounter: Payer: Self-pay | Admitting: Family Medicine

## 2016-05-22 VITALS — BP 131/77 | HR 89 | Temp 98.6°F | Resp 16 | Ht 69.5 in | Wt 244.0 lb

## 2016-05-22 DIAGNOSIS — L918 Other hypertrophic disorders of the skin: Secondary | ICD-10-CM

## 2016-05-22 NOTE — Progress Notes (Signed)
By signing my name below, I, Mesha Guinyard, attest that this documentation has been prepared under the direction and in the presence of Delman Cheadle, MD.  Electronically Signed: Verlee Monte, Medical Scribe. 05/22/16. 2:10 PM.  Subjective:    Patient ID: Richard Reyes, male    DOB: 1966-01-04, 51 y.o.   MRN: [id number]  HPI Chief Complaint  Patient presents with  . skin tag removal armpits    HPI Comments: Richard Reyes is a 51 y.o. male who presents to Primary Care at [facility] for skin tag follow-up located in his axilla. States the removed moles still have the base remaining and they are "poking him" + painful. The remaining warts are irritated when rubbed against clothing and when his skin rubs against them. Pt has been using OTC aspercreme for some relief of his sxs, and last used 25 mins before arrival.  Patient Active Problem List   Diagnosis Date Noted  . Asthma 12/17/2015  . Class 2 obesity due to excess calories with serious comorbidity and body mass index (BMI) of 35.0 to 35.9 in adult 12/17/2015  . Hepatic steatosis 12/17/2015  . S/P hemorrhoidectomy 01/17/2014  . Hemorrhoids 11/08/2013  . Acne vulgaris 09/27/2013  . OSA on CPAP 07/17/2013  . Insomnia with sleep apnea 07/17/2013  . Hypersomnia 06/02/2013  . BACK PAIN, LUMBAR 04/16/2008  . OBSTRUCTIVE SLEEP APNEA 11/15/2007  . OTHER NONSPECIFIC ABNORMAL SERUM ENZYME LEVELS 10/19/2007  . DEPRESSION 10/09/2007  . TESTOSTERONE DEFICIENCY 10/06/2007  . PURE HYPERCHOLESTEROLEMIA 10/06/2007  . INSOMNIA 10/06/2007   Past Medical History:  Diagnosis Date  . Anxiety   . Asthma    exercised induced  . Insomnia w/ sleep apnea   . OSA on CPAP   . Rhinitis, allergic    Past Surgical History:  Procedure Laterality Date  . HEMORRHOID SURGERY N/A 10/16/2013   Procedure: EXCISION OF EXTERNAL HEMORRHOIDS X 2 AND ANAL EXAM UNDER ANESTHESIA;  Surgeon: Gayland Curry, MD;  Location: [facility];  Service: General;   Laterality: N/A;  . NOSE SURGERY     Allergies  Allergen Reactions  . Sulfonamide Derivatives    Prior to Admission medications   Medication Sig Start Date End Date Taking? Authorizing Provider  albuterol (PROAIR HFA) 108 (90 Base) MCG/ACT inhaler Inhale 2 puffs into the lungs every 4 (four) hours as needed for wheezing or shortness of breath. 05/09/16   Shawnee Knapp, MD  lidocaine-prilocaine (EMLA) cream Apply 1 application topically as needed. 30 to 60 minutes prior to doctor visit 05/14/16   Shawnee Knapp, MD   Social History   Social History  . Marital status: Single    Spouse name: N/A  . Number of children: 0  . Years of education: 87   Occupational History  .  [employer]   Social History Main Topics  . Smoking status: Former Smoker    Quit date: 05/18/2007  . Smokeless tobacco: Never Used  . Alcohol use 0.0 oz/week     Comment: rarely  . Drug use: No  . Sexual activity: Not on file   Other Topics Concern  . Not on file   Social History Narrative   Patient is single and lives alone.   Patient has a college education.   Patient is working full time.   Patient is right-handed.   Patient does not drink any caffeine.   Review of Systems  Skin: Positive for color change (around the border). Negative for rash and wound.  positive for skin tag   Objective:  Physical Exam  Constitutional: He appears well-developed and well-nourished. No distress.  HENT:  Head: Normocephalic and atraumatic.  Eyes: Conjunctivae are normal.  Neck: Neck supple.  Cardiovascular: Normal rate.   Pulmonary/Chest: Effort normal.  Neurological: He is alert.  Skin: Skin is warm and dry.  10 1x2 mm flesh colored skin tags spread over left axilla 1 on right 1 on left neck  Psychiatric: He has a normal mood and affect. His behavior is normal.  Nursing note and vitals reviewed.   Vitals:   05/22/16 1404  BP: 131/77  Pulse: 89  Resp: 16  Temp: 98.6 F (37 C)  TempSrc: Oral    SpO2: 94%  Weight: 244 lb (110.7 kg)  Height: 5' 9.5" (1.765 m)   Body mass index is 35.52 kg/m.   Assessment & Plan:  Informed consent obtained Pt had pretreated aspercreme with lidocaine 45 mins prior Removed with forceps and scissors Pt tolerated well EBL less than 2 cc's  Hemostasis achieved with drysol Dressing applied to 1 on arm that was continuing to ooze Wound care reviewed Cryo surgery applied to 5 other larger flat tags - approx 1x2 mm  1. Skin tag     RTC prn.  If recur and need more removed in future, call ahead and will send in Emla to pretreat with.  I personally performed the services described in this documentation, which was scribed in my presence. The recorded information has been reviewed and considered, and addended by me as needed.   Delman Cheadle, M.D.  Primary Care at [facility] [address] [place],  [address] [phone] phone [phone] fax  05/22/16 4:09 PM

## 2016-05-22 NOTE — Patient Instructions (Addendum)
     IF you received an x-ray today, you will receive an invoice from [facility]. Please contact [facility] at [phone] with questions or concerns regarding your invoice.   IF you received labwork today, you will receive an invoice from [facility]. Please contact [facility] at [phone] with questions or concerns regarding your invoice.   Our billing staff will not be able to assist you with questions regarding bills from these companies.  You will be contacted with the lab results as soon as they are available. The fastest way to get your results is to activate your My Chart account. Instructions are located on the last page of this paperwork. If you have not heard from [place] regarding the results in 2 weeks, please contact this office.     Skin Tag, Adult A skin tag (acrochordon) is a soft, extra growth of skin. Most skin tags are flesh-colored and rarely bigger than a pencil eraser. They commonly form near areas where there are folds in the skin, such as the armpit or groin. Skin tags are not dangerous, and they do not spread from person to person (are not contagious). You may have one skin tag or several. Skin tags do not require treatment. However, your health care provider may recommend removal of a skin tag if it:  Gets irritated from clothing.  Bleeds.  Is visible and unsightly. Your health care provider can remove skin tags with a simple surgical procedure or a procedure that involves freezing the skin tag. Follow these instructions at home:  Watch for any changes in your skin tag. A normal skin tag does not require any other special care at home.  Take over-the-counter and prescription medicines only as told by your health care provider.  Keep all follow-up visits as told by your health care provider. This is important. Contact a health care provider if:  You have a skin tag that:  Becomes painful.  Changes color.  Bleeds.  Swells.  You develop  more skin tags. This information is not intended to replace advice given to you by your health care provider. Make sure you discuss any questions you have with your health care provider. Document Released: 01/06/2015 Document Revised: 08/18/2015 Document Reviewed: 01/06/2015 Elsevier Interactive Patient Education  2017 [employer].

## 2016-05-26 NOTE — Telephone Encounter (Signed)
What is the status of this?

## 2016-05-27 NOTE — Telephone Encounter (Signed)
Richard Reyes confirmed with the company - they just sen the kit out on 5/15.

## 2016-05-27 NOTE — Telephone Encounter (Signed)
Sent to lab to advise

## 2016-07-13 ENCOUNTER — Ambulatory Visit: Payer: Managed Care, Other (non HMO) | Admitting: Neurology

## 2016-08-20 ENCOUNTER — Ambulatory Visit: Payer: 59 | Admitting: Family Medicine

## 2016-12-21 ENCOUNTER — Ambulatory Visit: Payer: BLUE CROSS/BLUE SHIELD | Admitting: Physician Assistant

## 2016-12-21 ENCOUNTER — Other Ambulatory Visit: Payer: Self-pay

## 2016-12-21 ENCOUNTER — Encounter: Payer: Self-pay | Admitting: Physician Assistant

## 2016-12-21 VITALS — BP 140/78 | HR 94 | Temp 98.1°F | Resp 18 | Ht 70.39 in | Wt 250.8 lb

## 2016-12-21 DIAGNOSIS — J45990 Exercise induced bronchospasm: Secondary | ICD-10-CM

## 2016-12-21 DIAGNOSIS — R0981 Nasal congestion: Secondary | ICD-10-CM

## 2016-12-21 DIAGNOSIS — J029 Acute pharyngitis, unspecified: Secondary | ICD-10-CM

## 2016-12-21 DIAGNOSIS — K122 Cellulitis and abscess of mouth: Secondary | ICD-10-CM

## 2016-12-21 LAB — POCT RAPID STREP A (OFFICE): Rapid Strep A Screen: NEGATIVE

## 2016-12-21 MED ORDER — PREDNISONE 20 MG PO TABS: 40.0000 mg | ORAL_TABLET | Freq: Every day | ORAL | 0 refills | Status: AC

## 2016-12-21 MED ORDER — AMOXICILLIN 500 MG PO CAPS: 500.0000 mg | ORAL_CAPSULE | Freq: Two times a day (BID) | ORAL | 0 refills | Status: DC

## 2016-12-21 MED ORDER — ALBUTEROL SULFATE HFA 108 (90 BASE) MCG/ACT IN AERS: 2.0000 | INHALATION_SPRAY | RESPIRATORY_TRACT | 1 refills | Status: AC | PRN

## 2016-12-21 MED ORDER — OXYMETAZOLINE HCL 0.05 % NA SOLN: 1.0000 | Freq: Two times a day (BID) | NASAL | 0 refills | Status: AC

## 2016-12-21 NOTE — Progress Notes (Signed)
MRN: [id number] DOB: [birth year]  Subjective:   Richard Reyes is a 51 y.o. male presenting for chief complaint of Sore Throat (X 1 day) .  Reports history of sudden onset sore throat, nasal congestion, and hoarse throat. Has felt clammy. Has had postnasal drip, making him cough.  Has not tried anything for relief.  He is tolerating oral liquids and foods appropriately.  Denies fever, difficulty swallowing,  sinus pain, ear pain, difficulty swallowing, wheezing, shortness of breath, chest tightness and myalgia, nausea, vomiting, abdominal pain and diarrhea. Has not had sick contact with anyone. Has history of seasonal allergies, has history of exercise induced asthma, he has misplaced his albuterol inhaler and needs a refill.  Notes he only uses his albuterol inhaler before exercise.  Patient has had flu shot this season. Denies smoking.  Denies any other aggravating or relieving factors, no other questions or concerns.  Richard Reyes has a current medication list which includes the following prescription(s): lidocaine-prilocaine, albuterol, amoxicillin, oxymetazoline, and prednisone. Also is allergic to sulfonamide derivatives.  Richard Reyes  has a past medical history of Anxiety, Asthma, Insomnia w/ sleep apnea, OSA on CPAP, and Rhinitis, allergic. Also  has a past surgical history that includes Nose surgery and Hemorrhoid surgery (N/A, 10/16/2013).   Objective:   Vitals: BP 140/78 (BP Location: Left Arm, Patient Position: Sitting, Cuff Size: Large)   Pulse 94   Temp 98.1 F (36.7 C) (Oral)   Resp 18   Ht 5' 10.39" (1.788 m)   Wt 250 lb 12.8 oz (113.8 kg)   SpO2 97%   BMI 35.58 kg/m   Physical Exam  Constitutional: He is oriented to person, place, and time. He appears well-developed and well-nourished. He appears distressed (appears like he does not feel well).  HENT:  Head: Normocephalic and atraumatic.  Right Ear: Tympanic membrane is not erythematous and not bulging. A middle ear effusion is  present.  Left Ear: Tympanic membrane is not erythematous. A middle ear effusion is present.  Nose: Mucosal edema ( Moderate bilaterally) and rhinorrhea present. Right sinus exhibits no maxillary sinus tenderness and no frontal sinus tenderness. Left sinus exhibits no maxillary sinus tenderness and no frontal sinus tenderness.  Mouth/Throat: Uvula is midline and mucous membranes are normal. No trismus in the jaw. Uvula swelling (Moderate swelling noted with overlying erythema ) present. Posterior oropharyngeal erythema present. No tonsillar abscesses. Tonsils are 2+ on the right. Tonsils are 2+ on the left. No tonsillar exudate.  Eyes: EOM are normal. Right conjunctiva is injected ( Mild). Left conjunctiva is injected ( Mild).  Neck: Normal range of motion.  Cardiovascular: Normal rate, regular rhythm and normal heart sounds.  Pulmonary/Chest: Effort normal and breath sounds normal. No stridor. No respiratory distress. He has no wheezes. He has no rales.  Lymphadenopathy:       Head (right side): No submental, no submandibular, no tonsillar, no preauricular, no posterior auricular and no occipital adenopathy present.       Head (left side): No submental, no submandibular, no tonsillar, no preauricular, no posterior auricular and no occipital adenopathy present.    He has no cervical adenopathy.       Right: No supraclavicular adenopathy present.       Left: No supraclavicular adenopathy present.  Neurological: [name], place, and time.  Skin: Skin is warm and dry.  Psychiatric: He has a normal mood and affect.  Vitals reviewed.   Results for orders placed or performed in visit  on 12/21/16 (from the past 24 hour(s))  POCT rapid strep A     Status: Normal   Collection Time: 12/21/16  3:37 PM  Result Value Ref Range   Rapid Strep A Screen Negative Negative    Assessment and Plan :  1. Sore throat - POCT rapid strep A - Culture, Group A Strep 2. Uvulitis History  and physical exam findings consistent with uvulitis.  Rapid strep test negative.  Strep culture pending.  Will treat for underlying inflammation and bacterial etiology.  Patient is afebrile.  He denies difficulty swallowing.  He is tolerating oral liquids and foods appropriately.  Advised to return to clinic if symptoms worsen, do not improve, or as needed. - amoxicillin (AMOXIL) 500 MG capsule; Take 1 capsule (500 mg total) by mouth 2 (two) times daily.  Dispense: 20 capsule; Refill: 0 - predniSONE (DELTASONE) 20 MG tablet; Take 2 tablets (40 mg total) by mouth daily with breakfast for 5 days.  Dispense: 10 tablet; Refill: 0 3. Exercise-induced asthma Lungs are CTAB. Refill provided. - albuterol (PROAIR HFA) 108 (90 Base) MCG/ACT inhaler; Inhale 2 puffs into the lungs every 4 (four) hours as needed for wheezing or shortness of breath.  Dispense: 1 Inhaler; Refill: 1 4. Nasal congestion Given Rx for Afrin for nasal congestion.  Encouraged to discontinue use after 3 days. - oxymetazoline (AFRIN) 0.05 % nasal spray; Place 1 spray into both nostrils 2 (two) times daily for 3 days.  Dispense: 30 mL; Refill: 0  Tenna Delaine, PA-C  [facility] 12/21/2016 6:43 PM

## 2016-12-21 NOTE — Patient Instructions (Signed)
We are going to treat your uvulitis with both an antibiotic and prednisone.  Please take antibiotic as prescribed.  We have also sent off a strep culture and should have those results within 4 days.  Prednisone is a steroid and can cause side effects such as headache, irritability, nausea, vomiting, increased heart rate, increased blood pressure, increased blood sugar, appetite changes, and insomnia. Please take tablets in the morning with a full meal to help decrease the chances of these side effects.  I have also given you a nasal spray for your nasal congestion.  Please only use for 3 days, as longer use can cause worsening nasal congestion.  I have also refilled your albuterol inhaler. Please return to clinic if symptoms worsen, do not improve, or as needed.    Uvulitis Uvulitis is infection or inflammation of the uvula. The uvula is the small, finger-like piece of tissue that hangs down at the back of your throat. What are the causes? This condition may be caused by:  An infection in the mouth or throat. This is the most common cause.  Trauma to the uvula. Causes of trauma include burning your mouth and heavy snoring.  Fluid build-up (edema). Edema can be triggered be an allergic reaction. Uvulitis that is caused by edema is called Quincke disease.  Inhaling irritants, such as chemical agents, smoke, or steam.  What are the signs or symptoms? Symptoms of this condition depend on the cause. Symptoms of uvulitis that is caused by infection include:  Red, swollen uvula.  Sore throat.  Fever.  Headache.  Swollen neck glands.  Symptoms of uvulitis that is caused by trauma, edema, or irritation include:  Red, swollen uvula.  Sore throat.  Trouble swallowing.  Choking or gagging.  Trouble breathing.  How is this diagnosed? This condition is diagnosed with a physical exam. You also may have tests, such as a throat culture and blood tests. How is this treated? Treatment for  this condition depends on the cause. Treatment may involve:  Antibiotic medicine. Antibiotics may be prescribed if a bacterial infection is the cause.  Steroid medicine. Steroids may be given if edema is the cause.  Surgery to remove part of the uvula (partial uvulectomy).  Follow these instructions at home:  Rest as much as possible until your condition improves.  Drink enough fluid to keep your urine clear or pale yellow.  Take over-the-counter and prescription medicines only as told by your health care provider.  If you were prescribed an antibiotic medicine, take it as told by your health care provider. Do not stop taking the antibiotic even if you start to feel better.  Use a cool-mist humidifier to ease irritation in your throat.  While your throat is sore: ? Eat soft foods or drink liquids, such as soup. ? Gargle with a salt-water mixture 3-4 times per day or as needed. To make a salt-water mixture, completely dissolve -1 tsp of salt in 1 cup of warm water.  Keep all follow-up visits as told by your health care provider. This is important. Contact a health care provider if:  You have a fever.  You have trouble eating.  Your symptoms do not get better.  Your symptoms come back after treatment. Get help right away if:  You have trouble breathing.  You have trouble swallowing. This information is not intended to replace advice given to you by your health care provider. Make sure you discuss any questions you have with your health care provider. Document  Released: 08/02/2003 Document Revised: 08/25/2015 Document Reviewed: 03/14/2014 Elsevier Interactive Patient Education  [employer].

## 2016-12-24 LAB — CULTURE, GROUP A STREP

## 2017-04-14 ENCOUNTER — Encounter: Payer: Self-pay | Admitting: Physician Assistant

## 2019-06-15 ENCOUNTER — Encounter: Payer: Self-pay | Admitting: Gastroenterology

## 2019-06-26 ENCOUNTER — Ambulatory Visit (HOSPITAL_COMMUNITY)
Admission: EM | Admit: 2019-06-26 | Discharge: 2019-06-26 | Disposition: A | Discharge status: Home / Self Care | Payer: BC Managed Care – PPO | Attending: Family Medicine | Admitting: Family Medicine

## 2019-06-26 ENCOUNTER — Other Ambulatory Visit: Payer: Self-pay

## 2019-06-26 ENCOUNTER — Encounter (HOSPITAL_COMMUNITY): Payer: Self-pay

## 2019-06-26 DIAGNOSIS — H9312 Tinnitus, left ear: Secondary | ICD-10-CM | POA: Diagnosis not present

## 2019-06-26 NOTE — ED Triage Notes (Signed)
Pt presents with left ear fullness; pt states he feels pressure and like a ocean noise in that ear for past few days.

## 2019-06-27 NOTE — ED Provider Notes (Signed)
Richard Reyes   [id number] 06/26/19 Arrival Time: 0177  ASSESSMENT & PLAN:  1. Tinnitus of left ear     No sign of infection. No appreciable TM rupture.  Recommend:  Follow-up Information    Schedule an appointment as soon as possible for a visit  with [facility].   Contact information: [address] [place] [address] [phone]               Reviewed expectations re: course of current medical issues. Questions answered. Outlined signs and symptoms indicating need for more acute intervention. Patient verbalized understanding. After Visit Summary given.   SUBJECTIVE: History from: patient.  Richard Reyes is a 54 y.o. male who presents with complaint of left ear fullness; pressure; 'hear a swooshing, ocean-like sound"; persistent; fairly abrupt onset past few days; no ear pain; no ear drainage or bleeding; does use Q-tips; no trauma/barotrauma. Feels hearing is ok. No new medications.   Social History   Tobacco Use  Smoking Status Former Smoker  . Types: Cigarettes  . Quit date: 05/18/2007  . Years since quitting: 12.1  Smokeless Tobacco Never Used     OBJECTIVE:  Vitals:   06/26/19 1645  BP: (!) 146/82  Pulse: 82  Resp: 16  Temp: 98.3 F (36.8 C)  TempSrc: Oral  SpO2: 99%     General appearance: alert; NAD Ear Canal: normal TM: bilateral: normal Neck: supple without LAD Lungs: unlabored respirations Skin: warm and dry Psychological: alert and cooperative; normal mood and affect  Allergies  Allergen Reactions  . Sulfonamide Derivatives     Past Medical History:  Diagnosis Date  . Anxiety   . Asthma    exercised induced  . Insomnia w/ sleep apnea   . OSA on CPAP   . Rhinitis, allergic    Family History  Problem Relation Age of Onset  . Cancer Maternal Grandmother    Social History   Socioeconomic History  . Marital status: Single    Spouse name: Not on file  .  Number of children: 0  . Years of education: 58  . Highest education level: Not on file  Occupational History    Employer: [employer]  Tobacco Use  . Smoking status: Former Smoker    Types: Cigarettes    Quit date: 05/18/2007    Years since quitting: 12.1  . Smokeless tobacco: Never Used  Vaping Use  . Vaping Use: Never used  Substance and Sexual Activity  . Alcohol use: Yes    Alcohol/week: 0.0 standard drinks    Comment: rarely  . Drug use: No  . Sexual activity: Not Currently  Other Topics Concern  . Not on file  Social History Narrative   Patient is single and lives alone.   Patient has a college education.   Patient is working full time.   Patient is right-handed.   Patient does not drink any caffeine.   Social Determinants of Health   Financial Resource Strain:   . Difficulty of Paying Living Expenses:   Food Insecurity:   . Worried About Charity fundraiser in the Last Year:   . Arboriculturist in the Last Year:   Transportation Needs:   . Film/video editor (Medical):   Marland Kitchen Lack of Transportation (Non-Medical):   Physical Activity:   . Days of Exercise per Week:   . Minutes of Exercise per Session:   Stress:   . Feeling of  Stress :   Social Connections:   . Frequency of Communication with Friends and Family:   . Frequency of Social Gatherings with Friends and Family:   . Attends Religious Services:   . Active Member of Clubs or Organizations:   . Attends Archivist Meetings:   Marland Kitchen Marital Status:   Intimate Partner Violence:   . Fear of Current or Ex-Partner:   . Emotionally Abused:   Marland Kitchen Physically Abused:   . Sexually Abused:             Vanessa Kick, MD 06/28/19 1001

## 2019-07-25 ENCOUNTER — Other Ambulatory Visit: Payer: Self-pay

## 2019-07-25 DIAGNOSIS — I8390 Asymptomatic varicose veins of unspecified lower extremity: Secondary | ICD-10-CM

## 2019-08-03 ENCOUNTER — Other Ambulatory Visit: Payer: Self-pay

## 2019-08-03 ENCOUNTER — Ambulatory Visit: Payer: BC Managed Care – PPO | Admitting: Physician Assistant

## 2019-08-03 ENCOUNTER — Ambulatory Visit (HOSPITAL_COMMUNITY)
Admission: RE | Admit: 2019-08-03 | Discharge: 2019-08-03 | Disposition: A | Discharge status: Home / Self Care | Payer: BC Managed Care – PPO | Source: Ambulatory Visit | Attending: Vascular Surgery | Admitting: Vascular Surgery

## 2019-08-03 DIAGNOSIS — I8390 Asymptomatic varicose veins of unspecified lower extremity: Secondary | ICD-10-CM

## 2019-08-03 DIAGNOSIS — I8391 Asymptomatic varicose veins of right lower extremity: Secondary | ICD-10-CM | POA: Diagnosis not present

## 2019-08-03 DIAGNOSIS — I872 Venous insufficiency (chronic) (peripheral): Secondary | ICD-10-CM | POA: Insufficient documentation

## 2019-08-03 NOTE — Progress Notes (Signed)
Office Note     CC:  follow up Requesting Provider:  Shawnee Knapp, MD  HPI: Richard Reyes is a 54 y.o. (1965-03-12) male who presents for evaluation of prominent spider veins of right lower leg.  Patient states he is started noticing these more and more over the past several months.  He denies any history of DVT, venous ulcerations, trauma, or prior vascular procedures.  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  He does not make an attempt to elevate his legs or wear compression stockings.  He also denies claudication, rest pain, or nonhealing wounds of bilateral lower extremities.  He is a former smoker.   Past Medical History:  Diagnosis Date  . Anxiety   . Asthma    exercised induced  . Insomnia w/ sleep apnea   . OSA on CPAP   . Rhinitis, allergic     Past Surgical History:  Procedure Laterality Date  . HEMORRHOID SURGERY N/A 10/16/2013   Procedure: EXCISION OF EXTERNAL HEMORRHOIDS X 2 AND ANAL EXAM UNDER ANESTHESIA;  Surgeon: Gayland Curry, MD;  Location: [facility];  Service: General;  Laterality: N/A;  . NOSE SURGERY      Social History   Socioeconomic History  . Marital status: Single    Spouse name: Not on file  . Number of children: 0  . Years of education: 52  . Highest education level: Not on file  Occupational History    Employer: [employer]  Tobacco Use  . Smoking status: Former Smoker    Types: Cigarettes    Quit date: 05/18/2007    Years since quitting: 12.2  . Smokeless tobacco: Never Used  Vaping Use  . Vaping Use: Never used  Substance and Sexual Activity  . Alcohol use: Yes    Alcohol/week: 0.0 standard drinks    Comment: rarely  . Drug use: No  . Sexual activity: Not Currently  Other Topics Concern  . Not on file  Social History Narrative   Patient is single and lives alone.   Patient has a college education.   Patient is working full time.   Patient is right-handed.   Patient does not drink any caffeine.    Social Determinants of Health   Financial Resource Strain:   . Difficulty of Paying Living Expenses:   Food Insecurity:   . Worried About Charity fundraiser in the Last Year:   . Arboriculturist in the Last Year:   Transportation Needs:   . Film/video editor (Medical):   Marland Kitchen Lack of Transportation (Non-Medical):   Physical Activity:   . Days of Exercise per Week:   . Minutes of Exercise per Session:   Stress:   . Feeling of Stress :   Social Connections:   . Frequency of Communication with Friends and Family:   . Frequency of Social Gatherings with Friends and Family:   . Attends Religious Services:   . Active Member of Clubs or Organizations:   . Attends Archivist Meetings:   Marland Kitchen Marital Status:   Intimate Partner Violence:   . Fear of Current or Ex-Partner:   . Emotionally Abused:   Marland Kitchen Physically Abused:   . Sexually Abused:     Family History  Problem Relation Age of Onset  . Cancer Maternal Grandmother     Current Outpatient Medications  Medication Sig Dispense Refill  . albuterol (PROAIR HFA) 108 (90 Base) MCG/ACT inhaler  Inhale 2 puffs into the lungs every 4 (four) hours as needed for wheezing or shortness of breath. 1 Inhaler 1  . amoxicillin (AMOXIL) 500 MG capsule Take 1 capsule (500 mg total) by mouth 2 (two) times daily. 20 capsule 0  . Doxepin HCl 3 MG TABS TAKE 1 TO 2 TABLETS BY MOUTH EVERY NIGHT AT BEDTIME AS NEEDED FOR SLEEP    . lidocaine-prilocaine (EMLA) cream Apply 1 application topically as needed. 30 to 60 minutes prior to doctor visit 30 g 2   No current facility-administered medications for this visit.    Allergies  Allergen Reactions  . Sulfonamide Derivatives      REVIEW OF SYSTEMS:   [X]  denotes positive finding, [ ]  denotes negative finding Cardiac  Comments:  Chest pain or chest pressure:    Shortness of breath upon exertion:    Short of breath when lying flat:    Irregular heart rhythm:        Vascular    Pain in  calf, thigh, or hip brought on by ambulation:    Pain in feet at night that wakes you up from your sleep:     Blood clot in your veins:    Leg swelling:         Pulmonary    Oxygen at home:    Productive cough:     Wheezing:         Neurologic    Sudden weakness in arms or legs:     Sudden numbness in arms or legs:     Sudden onset of difficulty speaking or slurred speech:    Temporary loss of vision in one eye:     Problems with dizziness:         Gastrointestinal    Blood in stool:     Vomited blood:         Genitourinary    Burning when urinating:     Blood in urine:        Psychiatric    Major depression:         Hematologic    Bleeding problems:    Problems with blood clotting too easily:        Skin    Rashes or ulcers:        Constitutional    Fever or chills:      PHYSICAL EXAMINATION:  Vitals:   08/03/19 1343  BP: (!) 147/96  Pulse: 93  Resp: 20  Temp: 98.3 F (36.8 C)  TempSrc: Temporal  Weight: (!) 236 lb 9.6 oz (107.3 kg)  Height: 5\' 10"  (1.778 m)    General:  WDWN in NAD; vital signs documented above Gait: Not observed HENT: WNL, normocephalic Pulmonary: normal non-labored breathing Cardiac: regular HR Abdomen: soft, NT, no masses Skin: without rashes Vascular Exam/Pulses:  Right Left  Radial 2+ (normal) 2+ (normal)  DP 2+ (normal) 2+ (normal)   Extremities: without ischemic changes, without Gangrene , without cellulitis; without open wounds; no impressive edema on exam; 2-3 areas of prominent spider veins of R shin; various varicosities of R and L lower leg but asymptomatic Musculoskeletal: no muscle wasting or atrophy  Neurologic: A&O X 3;  No focal weakness or paresthesias are detected Psychiatric:  The pt has Normal affect.   Non-Invasive Vascular Imaging:   Negative for DVT Deep reflux in R CFV Superficial reflux in mid calf    ASSESSMENT/PLAN:: 54 y.o. male with spider veins of R lower leg  Venous reflux study negative  for DVT; reflux noted in R CFV and mid calf GSV only Recommended knee high compression and elevation of legs when possible; he was also measured for knee high stockings He was also offered sclerotherapy which he is agreeable; this will be performed in September per Estell Harpin, RN Otherwise patient can follow up as needed   Dagoberto Ligas, PA-C [facility] [phone]  Clinic MD:   Oneida Alar

## 2019-08-14 ENCOUNTER — Other Ambulatory Visit: Payer: Self-pay

## 2019-08-14 ENCOUNTER — Encounter: Payer: Self-pay | Admitting: Gastroenterology

## 2019-08-14 ENCOUNTER — Ambulatory Visit (AMBULATORY_SURGERY_CENTER): Payer: Self-pay | Admitting: *Deleted

## 2019-08-14 VITALS — Ht 70.0 in | Wt 236.0 lb

## 2019-08-14 DIAGNOSIS — Z1211 Encounter for screening for malignant neoplasm of colon: Secondary | ICD-10-CM

## 2019-08-14 MED ORDER — SUTAB 1479-225-188 MG PO TABS: 24.0000 | ORAL_TABLET | ORAL | 0 refills | Status: DC

## 2019-08-14 NOTE — Progress Notes (Signed)
No egg or soy allergy known to patient  No issues with past sedation with any surgeries or procedures no intubation problems in the past  No FH of Malignant Hyperthermia No diet pills per patient No home 02 use per patient  No blood thinners per patient  Pt denies issues with constipation  No A fib or A flutter  EMMI video to pt or via MyChart  COVID 19 guidelines implemented in [facility] today with Pt and RN   cov vax x 2    [employer] Coupon given to pt in [facility] today , Code to Pharmacy   Due to the COVID-19 pandemic we are asking patients to follow these guidelines. Please only bring one care partner. Please be aware that your care partner may wait in the car in the parking lot or if they feel like they will be too hot to wait in the car, they may wait in the lobby on the 4th floor. All care partners are required to wear a mask the entire time (we do not have any that we can provide them), they need to practice social distancing, and we will do a Covid check for all patient's and care partners when you arrive. Also we will check their temperature and your temperature. If the care partner waits in their car they need to stay in the parking lot the entire time and we will call them on their cell phone when the patient is ready for discharge so they can bring the car to the front of the building. Also all patient's will need to wear a mask into building.  

## 2019-08-24 ENCOUNTER — Ambulatory Visit (AMBULATORY_SURGERY_CENTER): Payer: BC Managed Care – PPO | Admitting: Gastroenterology

## 2019-08-24 ENCOUNTER — Encounter: Payer: Self-pay | Admitting: Gastroenterology

## 2019-08-24 ENCOUNTER — Other Ambulatory Visit: Payer: Self-pay

## 2019-08-24 VITALS — BP 120/73 | HR 81 | Temp 97.7°F | Resp 17 | Ht 70.0 in | Wt 236.0 lb

## 2019-08-24 DIAGNOSIS — D125 Benign neoplasm of sigmoid colon: Secondary | ICD-10-CM | POA: Diagnosis not present

## 2019-08-24 DIAGNOSIS — D123 Benign neoplasm of transverse colon: Secondary | ICD-10-CM

## 2019-08-24 DIAGNOSIS — Z1211 Encounter for screening for malignant neoplasm of colon: Secondary | ICD-10-CM

## 2019-08-24 DIAGNOSIS — D124 Benign neoplasm of descending colon: Secondary | ICD-10-CM

## 2019-08-24 MED ORDER — SODIUM CHLORIDE 0.9 % IV SOLN: 500.0000 mL | Freq: Once | INTRAVENOUS | Status: DC

## 2019-08-24 NOTE — Patient Instructions (Signed)
Handouts on polyps, diverticulosis, hemorrhoids, and high fiber diet given to you today  Await pathology results from Dr. Fuller Plan    YOU HAD AN ENDOSCOPIC PROCEDURE TODAY AT THE [facility]:   Refer to the procedure report that was given to you for any specific questions about what was found during the examination.  If the procedure report does not answer your questions, please call your gastroenterologist to clarify.  If you requested that your care partner not be given the details of your procedure findings, then the procedure report has been included in a sealed envelope for you to review at your convenience later.  YOU SHOULD EXPECT: Some feelings of bloating in the abdomen. Passage of more gas than usual.  Walking can help get rid of the air that was put into your GI tract during the procedure and reduce the bloating. If you had a lower endoscopy (such as a colonoscopy or flexible sigmoidoscopy) you may notice spotting of blood in your stool or on the toilet paper. If you underwent a bowel prep for your procedure, you may not have a normal bowel movement for a few days.  Please Note:  You might notice some irritation and congestion in your nose or some drainage.  This is from the oxygen used during your procedure.  There is no need for concern and it should clear up in a day or so.  SYMPTOMS TO REPORT IMMEDIATELY:   Following lower endoscopy (colonoscopy or flexible sigmoidoscopy):  Excessive amounts of blood in the stool  Significant tenderness or worsening of abdominal pains  Swelling of the abdomen that is new, acute  Fever of 100F or higher  For urgent or emergent issues, a gastroenterologist can be reached at any hour by calling [phone]. Do not use MyChart messaging for urgent concerns.    DIET:  We do recommend a small meal at first, but then you may proceed to your regular diet.  Drink plenty of fluids but you should avoid alcoholic beverages for 24  hours.  ACTIVITY:  You should plan to take it easy for the rest of today and you should NOT DRIVE or use heavy machinery until tomorrow (because of the sedation medicines used during the test).    FOLLOW UP: Our staff will call the number listed on your records 48-72 hours following your procedure to check on you and address any questions or concerns that you may have regarding the information given to you following your procedure. If we do not reach you, we will leave a message.  We will attempt to reach you two times.  During this call, we will ask if you have developed any symptoms of COVID 19. If you develop any symptoms (ie: fever, flu-like symptoms, shortness of breath, cough etc.) before then, please call [phone].  If you test positive for Covid 19 in the 2 weeks post procedure, please call and report this information to [place].    If any biopsies were taken you will be contacted by phone or by letter within the next 1-3 weeks.  Please call us at [phone] if you have not heard about the biopsies in 3 weeks.    SIGNATURES/CONFIDENTIALITY: You and/or your care partner have signed paperwork which will be entered into your electronic medical record.  These signatures attest to the fact that that the information above on your After Visit Summary has been reviewed and is understood.  Full responsibility of the confidentiality of this discharge information lies with  you and/or your care-partner.

## 2019-08-24 NOTE — Progress Notes (Signed)
Called to room to assist during endoscopic procedure.  Patient ID and intended procedure confirmed with present staff. Received instructions for my participation in the procedure from the performing physician.  

## 2019-08-24 NOTE — Progress Notes (Signed)
PT taken to PACU. Monitors in place. VSS. Report given to RN. 

## 2019-08-24 NOTE — Op Note (Signed)
[facility] Patient Name: Richard Reyes Procedure Date: 08/24/2019 9:02 AM MRN: [id number] Endoscopist: Ladene Artist , MD Age: 54 Referring MD:  Date of Birth: [birth year] Gender: Male Account #: [id number] Procedure:                Colonoscopy Indications:              Screening for colorectal malignant neoplasm Medicines:                Monitored Anesthesia Care Procedure:                Pre-Anesthesia Assessment:                           - Prior to the procedure, a History and Physical                            was performed, and patient medications and                            allergies were reviewed. The patient's tolerance of                            previous anesthesia was also reviewed. The risks                            and benefits of the procedure and the sedation                            options and risks were discussed with the patient.                            All questions were answered, and informed consent                            was obtained. Prior Anticoagulants: The patient has                            taken no previous anticoagulant or antiplatelet                            agents. ASA Grade Assessment: II - A patient with                            mild systemic disease. After reviewing the risks                            and benefits, the patient was deemed in                            satisfactory condition to undergo the procedure.                           After obtaining informed consent, the colonoscope  was passed under direct vision. Throughout the                            procedure, the patient's blood pressure, pulse, and                            oxygen saturations were monitored continuously. The                            Colonoscope was introduced through the anus and                            advanced to the the cecum, identified by                            appendiceal orifice and  ileocecal valve. The                            ileocecal valve, appendiceal orifice, and rectum                            were photographed. The quality of the bowel                            preparation was adequate. The colonoscopy was                            performed without difficulty. The patient tolerated                            the procedure well. Scope In: 9:11:16 AM Scope Out: 9:27:57 AM Scope Withdrawal Time: 0 hours 15 minutes 34 seconds  Total Procedure Duration: 0 hours 16 minutes 41 seconds  Findings:                 The perianal and digital rectal examinations were                            normal.                           Five sessile polyps were found in the sigmoid colon                            (2), descending colon (1) and transverse colon (2).                            The polyps were 8 to 10 mm in size. These polyps                            were removed with a cold snare. Resection and                            retrieval were complete.  Multiple medium-mouthed diverticula were found in                            the left colon. There was evidence of diverticular                            spasm. There was evidence of an impacted                            diverticulum. There was no evidence of diverticular                            bleeding.                           Internal hemorrhoids were found during                            retroflexion. The hemorrhoids were small and Grade                            I (internal hemorrhoids that do not prolapse).                           The exam was otherwise without abnormality on                            direct and retroflexion views. Complications:            No immediate complications. Estimated blood loss:                            None. Estimated Blood Loss:     Estimated blood loss: none. Impression:               - Five 8 to 10 mm polyps in the sigmoid colon, in                             the descending colon and in the transverse colon,                            removed with a cold snare. Resected and retrieved.                           - Moderate diverticulosis in the left colon.                           - Internal hemorrhoids.                           - The examination was otherwise normal on direct                            and retroflexion views. Recommendation:           - Repeat colonoscopy with a more extensive bowel  prep after studies are complete for surveillance                            based on pathology results, likely in 3 years.                           - Patient has a contact number available for                            emergencies. The signs and symptoms of potential                            delayed complications were discussed with the                            patient. Return to normal activities tomorrow.                            Written discharge instructions were provided to the                            patient.                           - High fiber diet.                           - Continue present medications.                           - Await pathology results. Ladene Artist, MD 08/24/2019 9:32:29 AM This report has been signed electronically.

## 2019-08-28 ENCOUNTER — Telehealth: Payer: Self-pay

## 2019-08-28 NOTE — Telephone Encounter (Signed)
  Follow up Call-  Call back number 08/24/2019  Post procedure Call Back phone  # [phone]  Permission to leave phone message Yes  Some recent data might be hidden     Patient questions:  Do you have a fever, pain , or abdominal swelling? No. Pain Score  0 *  Have you tolerated food without any problems? Yes.    Have you been able to return to your normal activities? Yes.    Do you have any questions about your discharge instructions: Diet   No. Medications  No. Follow up visit  No.  Do you have questions or concerns about your Care? No.  Actions: * If pain score is 4 or above: No action needed, pain <4.  1. Have you developed a fever since your procedure? no  2.   Have you had an respiratory symptoms (SOB or cough) since your procedure? no  3.   Have you tested positive for COVID 19 since your procedure no  4.   Have you had any family members/close contacts diagnosed with the COVID 19 since your procedure?  no   If yes to any of these questions please route to Joylene John, RN and Joella Prince, RN

## 2019-09-04 ENCOUNTER — Encounter: Payer: Self-pay | Admitting: Gastroenterology

## 2019-09-22 ENCOUNTER — Ambulatory Visit: Payer: BC Managed Care – PPO

## 2019-09-29 ENCOUNTER — Other Ambulatory Visit: Payer: Self-pay

## 2019-09-29 ENCOUNTER — Ambulatory Visit: Payer: Self-pay

## 2019-09-29 DIAGNOSIS — I8393 Asymptomatic varicose veins of bilateral lower extremities: Secondary | ICD-10-CM

## 2019-09-29 DIAGNOSIS — I8391 Asymptomatic varicose veins of right lower extremity: Secondary | ICD-10-CM

## 2019-09-29 NOTE — Progress Notes (Signed)
Treated pt's R LE smaller spider and reticular veins with Aslcera 1% administered with a 27g butterfly.  Patient received a total of 1 mL. Pt found each stick to be painful and opted to not treat his inner ankle/foot small spider veins. Anticipate good results. Post procedure care instructions were provided on both handout and verbal. Will follow PRN.      Photos: Yes.    Compression stockings applied: Yes.

## 2019-10-27 ENCOUNTER — Telehealth: Payer: Self-pay

## 2019-10-27 NOTE — Telephone Encounter (Signed)
Called pt to f/u on his sclero treatment last month. He is seeing visible fading this week after some mild darkening/bruising. He is pleased with results and will call back if he has anything arise or wants to do another treatment in the future.

## 2020-02-28 ENCOUNTER — Other Ambulatory Visit: Payer: Self-pay | Admitting: Family Medicine

## 2020-02-28 ENCOUNTER — Ambulatory Visit
Admission: RE | Admit: 2020-02-28 | Discharge: 2020-02-28 | Disposition: A | Discharge status: Home / Self Care | Payer: BC Managed Care – PPO | Source: Ambulatory Visit | Attending: Family Medicine | Admitting: Family Medicine

## 2020-02-28 DIAGNOSIS — M20012 Mallet finger of left finger(s): Secondary | ICD-10-CM

## 2020-02-28 DIAGNOSIS — M6789 Other specified disorders of synovium and tendon, multiple sites: Secondary | ICD-10-CM

## 2020-09-26 ENCOUNTER — Encounter: Payer: Self-pay | Admitting: Family Medicine

## 2020-09-26 ENCOUNTER — Ambulatory Visit: Payer: Self-pay

## 2020-09-26 ENCOUNTER — Ambulatory Visit (INDEPENDENT_AMBULATORY_CARE_PROVIDER_SITE_OTHER): Payer: 59

## 2020-09-26 ENCOUNTER — Other Ambulatory Visit: Payer: Self-pay

## 2020-09-26 ENCOUNTER — Ambulatory Visit: Payer: 59 | Admitting: Family Medicine

## 2020-09-26 VITALS — BP 140/84 | HR 89 | Ht 70.0 in | Wt 228.8 lb

## 2020-09-26 DIAGNOSIS — M25562 Pain in left knee: Secondary | ICD-10-CM

## 2020-09-26 DIAGNOSIS — G8929 Other chronic pain: Secondary | ICD-10-CM | POA: Diagnosis not present

## 2020-09-26 DIAGNOSIS — M25561 Pain in right knee: Secondary | ICD-10-CM | POA: Diagnosis not present

## 2020-09-26 NOTE — Patient Instructions (Signed)
Thank you for coming in today.   Please get an Xray today before you leave \\I've referred you to Physical Therapy.  Let us know if you don't hear from them in one week.   You should hear from MRI scheduling within 1 week. If you do not hear please let me know.    Recheck after the MRI.   Please use Voltaren gel (Generic Diclofenac Gel) up to 4x daily for pain as needed.  This is available over-the-counter as both the name brand Voltaren gel and the generic diclofenac gel.

## 2020-09-26 NOTE — Progress Notes (Signed)
Subjective:    CC: B knee pain, R>L  I, Richard Reyes, LAT, ATC, am serving as scribe for Dr. Lynne Leader.  HPI: Pt is a 55 y/o male presenting w/ c/o B knee pain but mainly chronic R knee pain x years.  He saw Dr. Mardelle Matte who dx him w/ a meniscus tear and had a menisectomy.  He then went to [facility] and had multiple injections.  He locates his pain to his R proximal, medial lower leg just distal to the knee joint.  His goals are to return to exercise.  He has been unable to exercise vigorously because of his knee pain.  R knee swelling: yes B knee mechanical symptoms: yes Aggravating factors: fig 4 position; deep squatting; prolonged sitting;  Treatments tried: prior R knee injections; prior PT; prior R knee menisectomy; knee compression  Diagnostic testing: R knee XR at [facility] a few years ago;   Pertinent review of Systems: No fevers or chills  Relevant historical information: Sleep apnea..   Objective:    Vitals:   09/26/20 1110  BP: 140/84  Pulse: 89  SpO2: 96%   General: Well Developed, well nourished, and in no acute distress.   MSK:  Right knee normal-appearing Normal motion with mild crepitation. Mildly tender palpation medial joint line and just distal to medial joint line at medial tibial plateau. Stable ligamentous exam. Intact strength to extension and flexion. Mildly positive McMurray's test medially.  Left knee normal-appearing Normal motion with mild crepitation. Mild tender palpation medial joint line. Stable ligamentous exam. Negative Murray's test. Intact strength.  Lab and Radiology Results  Diagnostic Limited MSK Ultrasound of: Right knee Quad tendon intact normal-appearing Trace joint effusion. Patellar tendon normal appearing Lateral joint line minimal degenerative changes. Medial joint line slightly narrowed with mild degenerative appearing medial meniscus. No significant Pes anserine bursitis.Marland Kitchen Posterior knee small Baker's  cyst. Impression: Medial degenerative changes and small Baker's cyst.  Diagnostic Limited MSK Ultrasound of: Left knee Quad tendon intact and normal-appearing Trace joint effusion. Patellar tendon is normal-appearing Lateral joint line minimal degenerative changes. Medial joint line mild degenerative changes. Posterior knee no Baker's cyst. Impression: Mild medial compartment DJD.  X-ray images bilateral knees obtained today personally and independently interpreted  Right knee: Mild medial compartment DJD.  Minimal patellofemoral DJD.  No acute fractures.  Left knee: Mild to minimal medial and patellofemoral degenerative changes.  No acute fractures.  Await formal radiology review   Impression and Recommendations:    Assessment and Plan: 55 y.o. male with persistent medial right knee pain.  Pain is maximal at just distal to the medial joint line at the medial tibial plateau.  He has had what sounds like extensive treatment at [facility] with multiple steroid injections and a trial of hyaluronic acid injections and even a Pes anserine bursa injection all with little benefit.  At this point he should be better based on his x-ray findings.  I think it is possible that were missing something.  He certainly has had good conservative management.  Plan to proceed to MRI to further characterize cause of knee pain and to dictate for future treatment options.  Additionally he should have physical therapy.  It is an obvious next step as well.  Plan to refer to PT for both the right knee and the left knee.  Left knee pain also chronic but certainly much less treatment has been tried yet.  Plan for x-ray and physical therapy.  If not improved  can start injection series etc.  Recheck after MRI.  PDMP not reviewed this encounter. Orders Placed This Encounter  Procedures   [place] LIMITED JOINT SPACE STRUCTURES LOW RIGHT(NO LINKED CHARGES)    Order Specific Question:   Reason for Exam  (SYMPTOM  OR DIAGNOSIS REQUIRED)    Answer:   R knee pain    Order Specific Question:   Preferred imaging location?    Answer:   [name]   DG Knee AP/LAT W/Sunrise Right    Standing Status:   Future    Number of Occurrences:   1    Standing Expiration Date:   09/26/2021    Order Specific Question:   Reason for Exam (SYMPTOM  OR DIAGNOSIS REQUIRED)    Answer:   eval knee pain    Order Specific Question:   Preferred imaging location?    Answer:   [name]   DG Knee AP/LAT W/Sunrise Left    Standing Status:   Future    Number of Occurrences:   1    Standing Expiration Date:   09/26/2021    Order Specific Question:   Reason for Exam (SYMPTOM  OR DIAGNOSIS REQUIRED)    Answer:   eval knee pain    Order Specific Question:   Preferred imaging location?    Answer:   [name]   MR Knee Right Wo Contrast    Standing Status:   Future    Standing Expiration Date:   09/26/2021    Order Specific Question:   What is the patient's sedation requirement?    Answer:   No Sedation    Order Specific Question:   Does the patient have a pacemaker or implanted devices?    Answer:   No    Order Specific Question:   Preferred imaging location?    Answer:   [facility] (table limit-350lbs)   Ambulatory referral to Physical Therapy    Referral Priority:   Routine    Referral Type:   Physical Medicine    Referral Reason:   Specialty Services Required    Requested Specialty:   Physical Therapy    Number of Visits Requested:   1   No orders of the defined types were placed in this encounter.   Discussed warning signs or symptoms. Please see discharge instructions. Patient expresses understanding.   The above documentation has been reviewed and is accurate and complete Lynne Leader, M.D.

## 2020-09-30 NOTE — Progress Notes (Signed)
Right knee x-ray also looks normal to radiology.

## 2020-09-30 NOTE — Progress Notes (Signed)
Left knee x-ray looks normal to radiology without significant arthritis or fractures.

## 2020-10-06 ENCOUNTER — Ambulatory Visit (INDEPENDENT_AMBULATORY_CARE_PROVIDER_SITE_OTHER): Payer: 59

## 2020-10-06 ENCOUNTER — Other Ambulatory Visit: Payer: Self-pay

## 2020-10-06 DIAGNOSIS — M25561 Pain in right knee: Secondary | ICD-10-CM

## 2020-10-06 DIAGNOSIS — G8929 Other chronic pain: Secondary | ICD-10-CM | POA: Diagnosis not present

## 2020-10-08 NOTE — Progress Notes (Signed)
MRI of the knee shows some irritation near the patella at the plica. Additionally there is a medium sized Baker's cyst. There is mild arthritis. It looks like evidence of a prior meniscus surgery is present on the MRI as well. Recommend recheck in clinic to go over the results of full detail.

## 2020-10-14 NOTE — Progress Notes (Signed)
I, Wendy Poet, LAT, ATC, am serving as scribe for Dr. Lynne Leader.  Richard Reyes is a 55 y.o. male who presents to [facility] today for f/u R knee pain and MRI review. Pt was previously seen by Dr. Mardelle Matte who dx him w/ a meniscus tear and had a menisectomy and he's had multiple steroid and hyaluronic injections at [facility]. Pt was last seen by Dr. Georgina Snell on 09/26/20 was advised to proceed to MRI to further characterize cause of knee pain and was referred to PT and has his first visit scheduled for later this afternoon. Today, pt reports that his R knee pain remains the same.  He will be going to PT today after his visit w/ me.    Dx imaging: R knee MRI-10/06/20  09/26/20 R & L knee XR  Pertinent review of systems: No fevers or chills  Relevant historical information: Sleep apnea.   Exam:  BP 130/86 (BP Location: Right Arm, Patient Position: Sitting, Cuff Size: Large)   Pulse 93   Ht 5\' 10"  (1.778 m)   Wt 234 lb 6.4 oz (106.3 kg)   SpO2 97%   BMI 33.63 kg/m  General: Well Developed, well nourished, and in no acute distress.   MSK: Right knee normal-appearing Normal motion. Mildly tender palpation pes anserine area.    Lab and Radiology Results EXAM: MRI OF THE RIGHT KNEE WITHOUT CONTRAST   TECHNIQUE: Multiplanar, multisequence MR imaging of the knee was performed. No intravenous contrast was administered.   COMPARISON:  11/29/2017   FINDINGS: MENISCI   Medial meniscus: Stable grade 3 oblique signal in the posterior horn medial meniscus extending to the inferior meniscal surface, the patient has reportedly had prior debridement and this may well represent postoperative signal rather than recurrent tear. This is not substantially changed from 11/29/2017.   Lateral meniscus:  Unremarkable   LIGAMENTS   Cruciates:  Unremarkable   Collaterals:  Unremarkable   CARTILAGE   Patellofemoral:  Mild chondral heterogeneity along the  patella.   Medial:  Moderate degenerative chondral thinning.   Lateral:  Mild degenerative chondral thinning.   Joint: Mild localized synovitis anterior to the transverse meniscal ligament for example on image 17 series 8, new from 11/29/2017, infrapatellar plica injury is a possibility.   Popliteal Fossa:  Moderate size Baker's cyst.   Extensor Mechanism:  Mild prepatellar subcutaneous edema.   Bones: No significant extra-articular osseous abnormalities identified.   Other: No supplemental non-categorized findings.   IMPRESSION: 1. New focal synovitis anterior to the transverse meniscal ligament and somewhat continuous with the infrapatellar plica. This could reflect infrapatellar plical injury. 2. Moderate-sized Baker's cyst. 3. Generally mild osteoarthritis. 4. Continued oblique grade 3 signal in the posterior horn medial meniscus extending to the inferior surface, the patient has had prior debridement and the appearance is unchanged from prior, and probably postoperative signal rather than recurrent tear.     Electronically Signed   By: Van Clines M.D.   On: 10/07/2020 15:06 I, Lynne Leader, personally (independently) visualized and performed the interpretation of the images attached in this note.     Assessment and Plan: 55 y.o. male with right knee pain.  Primary source of pain thought to be due to the anterior synovitis seen on MRI under impression #1.  Plan to treat with physical therapy.  PT starts today.  He will check back in a month and if not better will likely attempt to directly inject the area of synovitis  with steroid using an ultrasound guided injection.   Spent a fair amount of time today discussing the results of the MRI in detail but also spent quite a bit of time talking about exercise.  Talked about what exercise he can do and what exercise he should avoid.  Cycling and open chain quad strengthening exercises should be good.  Plyometrics and  running and heavy duty weight lifting including squats are probably to be avoided.  He is also interested in Pilates and we given some information on Pilates.      Discussed warning signs or symptoms. Please see discharge instructions. Patient expresses understanding.   The above documentation has been reviewed and is accurate and complete Lynne Leader, M.D.   Total encounter time 30 minutes including face-to-face time with the patient and, reviewing past medical record, and charting on the date of service.   As above

## 2020-10-15 ENCOUNTER — Encounter: Payer: Self-pay | Admitting: Family Medicine

## 2020-10-15 ENCOUNTER — Ambulatory Visit: Payer: 59 | Admitting: Family Medicine

## 2020-10-15 ENCOUNTER — Ambulatory Visit: Payer: 59 | Admitting: Physical Therapy

## 2020-10-15 ENCOUNTER — Other Ambulatory Visit: Payer: Self-pay

## 2020-10-15 VITALS — BP 130/86 | HR 93 | Ht 70.0 in | Wt 234.4 lb

## 2020-10-15 DIAGNOSIS — R600 Localized edema: Secondary | ICD-10-CM

## 2020-10-15 DIAGNOSIS — G8929 Other chronic pain: Secondary | ICD-10-CM | POA: Diagnosis not present

## 2020-10-15 DIAGNOSIS — M6281 Muscle weakness (generalized): Secondary | ICD-10-CM

## 2020-10-15 DIAGNOSIS — R262 Difficulty in walking, not elsewhere classified: Secondary | ICD-10-CM | POA: Diagnosis not present

## 2020-10-15 DIAGNOSIS — M25561 Pain in right knee: Secondary | ICD-10-CM

## 2020-10-15 NOTE — Patient Instructions (Signed)
Access Code: [email or web address] URL: [email or web address] Date: 10/15/2020 Prepared by: Elsie Ra  Exercises Supine Quadriceps Stretch with Strap on Table - 2 x daily - 6 x weekly - 3 reps - 30 hold Seated Straight Leg Heel Taps - 2 x daily - 6 x weekly - 3 sets - 15 reps Squat with Chair Touch - 2 x daily - 6 x weekly - 2-3 sets - 10 reps Mini Lunge - 2 x daily - 6 x weekly - 10 reps - 1-2 sets Single Leg Deadlift with Kettlebell - 2 x daily - 6 x weekly - 2-3 sets - 10 reps

## 2020-10-15 NOTE — Patient Instructions (Addendum)
Good to see you today.  [facility]: [address] [place], [place] [address] -[email or web address] [phone]  Con't PT and follow-up in one month.

## 2020-10-16 NOTE — Therapy (Signed)
[facility] Physical Therapy [address] [place], [place], [address] Phone: [phone]   Fax:  [phone]  Physical Therapy Evaluation  Patient Details  Name: Richard Reyes MRN: [id number] Date of Birth: 1965/05/03 Referring Provider (PT): Georgina Snell, MD   Encounter Date: 10/15/2020   PT End of Session - 10/16/20 0720     Visit Number 1    Number of Visits 12    Date for PT Re-Evaluation 11/27/20    Authorization Type UHC    PT Start Time 1600    PT Stop Time 1884    PT Time Calculation (min) 45 min    Activity Tolerance Patient tolerated treatment well    Behavior During Therapy WFL for tasks assessed/performed             Past Medical History:  Diagnosis Date   Allergy    Arthritis    bilat knee    Asthma    exercised induced   Insomnia w/ sleep apnea    OSA on CPAP    Rhinitis, allergic    Sleep apnea    wears cpap     Past Surgical History:  Procedure Laterality Date   HEMORRHOID SURGERY N/A 10/16/2013   Procedure: EXCISION OF EXTERNAL HEMORRHOIDS X 2 AND ANAL EXAM UNDER ANESTHESIA;  Surgeon: Gayland Curry, MD;  Location: [place];  Service: General;  Laterality: N/A;   NOSE SURGERY      There were no vitals filed for this visit.    Subjective Assessment - 10/16/20 0709     Subjective He is here for chronic pain of Rt knee referred by Dr. Georgina Snell.  Pt was previously seen by Dr. Mardelle Matte who dx him w/ a meniscus tear and had a menisectomy 2019 and he's had multiple steroid and hyaluronic injections at [facility]. He sits for work has now become less active and would like to get back to being active again    Pertinent History Rt menisectomy 2019    How long can you sit comfortably? depends    How long can you stand comfortably? depends    Diagnostic tests knee MRI IMPRESSION:1. New focal synovitis anterior to the transverse meniscal ligament and somewhat continuous with the infrapatellar plica. This could reflect  infrapatellar plical injury. 2. Moderate-sized Baker's cyst. 3. Generally mild osteoarthritis.4. Continued oblique grade 3 signal in the posterior horn medial meniscus extending to the inferior surface, the patient has had prior debridement and the appearance is unchanged from prior, and  probably postoperative signal rather than recurrent tear.    Patient Stated Goals reduce pain and get back to being active    Currently in Pain? Yes    Pain Score 5     Pain Location Knee    Pain Orientation Right    Pain Descriptors / Indicators Aching    Pain Type Chronic pain    Pain Radiating Towards denies N/T    Pain Onset More than a month ago    Pain Frequency Intermittent    Aggravating Factors  sitting too long, squatting, stairs    Pain Relieving Factors sometimes walking, rest    Multiple Pain Sites No                [facility] PT Assessment - 10/16/20 0001       Assessment   Medical Diagnosis Rt knee pain    Referring Provider (PT) Georgina Snell, MD    Onset Date/Surgical Date --   Chronic pain, worse over last year,  previous meniscectomy 2019     Precautions   Precautions None      Restrictions   Weight Bearing Restrictions No      Balance Screen   Has the patient fallen in the past 6 months No    Has the patient had a decrease in activity level because of a fear of falling?  No    Is the patient reluctant to leave their home because of a fear of falling?  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   Overall Cognitive Status Within [place] for tasks assessed      Observation/Other Assessments   Focus on Therapeutic Outcomes (FOTO)  54% functional, goal 67%      Functional Tests   Functional tests Squat;Step down;Single leg stance      Squat   Comments good ROM  but decreased weight shift to his Rt      Step Down   Comments apprehensive and prefers UE suppport but no valgus/varus noted      Single Leg Stance   Comments 5 sec avg with ankle instability noted      Strength   Right Hip Flexion --   53 lbs   Left Hip Flexion --   53 lbs   Right Knee Flexion --   35 lbs   Right Knee Extension --   77 lbs   Left Knee Flexion --   40 lbs   Left Knee Extension --   80 lbs     Palpation   Patella mobility decreased patella mobility on Rt compared to Lt, normal patella tracking      Ambulation/Gait   Gait Comments mildly antalgic gait on Rt, he is indpependent community ambulator                        Objective measurements completed on examination: See above findings.       [facility] Adult PT Treatment/Exercise - 10/16/20 0001       Modalities   Modalities Iontophoresis      Iontophoresis   Type of Iontophoresis Dexamethasone    Location Rt knee    Dose 1.0 CC    Time 4-6 hour wear home patch                     PT Education - 10/16/20 0720     Education Details HEP,POC,ionto, return to walking and gym recommmendations    Person(s) Educated Patient    Methods Explanation;Demonstration;Verbal cues;Handout    Comprehension Verbalized understanding;Need further instruction                 PT Long Term Goals - 10/16/20 0726       PT LONG TERM GOAL #1   Title Pt will be I and compliant with HEP. (target for all goals 6 weeks 11/27/20)    Status New    Target Date 11/27/20      PT LONG TERM GOAL #2   Title Pt will improve Rt leg strength and Rt knee ROM equal to Lt leg strength for functional symmetry.    Status New      PT LONG TERM GOAL #3  Title Pt will report overall less than 2-3/10 pain with usual activity, recreation and ADL's    Status New      PT LONG TERM GOAL #4   Title Pt will improve FOTO to at least 67% functional    Status New                    Plan - 10/16/20  1610     Clinical Impression Statement Pt presents with chronic Rt knee pain with synovitis. He has preveious knee scope with menisectomy 2019. He has overall mild ROM and strength deficits with some apprehension for funcitonal movements including squats, step downs, and SL activities. He will benefit from skilled PT to address his functional defiicits and decrease pain.    Personal Factors and Comorbidities Comorbidity 2    Comorbidities asthma, insomnia, knee scope    Examination-Participation Restrictions Cleaning;Occupation;Yard Work;Shop    Stability/Clinical Decision Making Stable/Uncomplicated    Clinical Decision Making Low    Rehab Potential Good    PT Frequency 2x / week    PT Duration 6 weeks    PT Treatment/Interventions Cryotherapy;Electrical Stimulation;Iontophoresis 4mg /ml Dexamethasone;Moist Heat;Ultrasound;Gait training;Stair training;Therapeutic activities;Therapeutic exercise;Balance training;Neuromuscular re-education;Manual techniques;Vasopneumatic Device;Taping;Dry needling;Passive range of motion    PT Next Visit Plan reivew HEP, how was ionto?    PT Home Exercise Plan Access Code: [email or web address]             Patient will benefit from skilled therapeutic intervention in order to improve the following deficits and impairments:  Decreased activity tolerance, Decreased endurance, Decreased range of motion, Decreased strength, Difficulty walking, Increased edema, Pain  Visit Diagnosis: Chronic pain of right knee  Muscle weakness (generalized)  Difficulty in walking, not elsewhere classified  Localized edema     Problem List Patient Active Problem List   Diagnosis Date Noted   Spider vein of right lower extremity 08/03/2019   Chronic venous insufficiency 08/03/2019   Asthma 12/17/2015   Class 2 obesity due to excess calories with serious comorbidity and body mass index (BMI) of 35.0 to 35.9 in adult 12/17/2015   Hepatic steatosis 12/17/2015   S/P hemorrhoidectomy  01/17/2014   Hemorrhoids 11/08/2013   Acne vulgaris 09/27/2013   OSA on CPAP 07/17/2013   Insomnia with sleep apnea 07/17/2013   Hypersomnia 06/02/2013   BACK PAIN, LUMBAR 04/16/2008   OBSTRUCTIVE SLEEP APNEA 11/15/2007   OTHER NONSPECIFIC ABNORMAL SERUM ENZYME LEVELS 10/19/2007   DEPRESSION 10/09/2007   TESTOSTERONE DEFICIENCY 10/06/2007   PURE HYPERCHOLESTEROLEMIA 10/06/2007   INSOMNIA 10/06/2007    Debbe Odea, PT,DPT 10/16/2020, 7:28 AM  [facility] Physical Therapy [address] [place], [place], [address] Phone: [phone]   Fax:  [phone]  Name: Richard Reyes MRN: [id number] Date of Birth: [birth year]

## 2020-10-30 ENCOUNTER — Other Ambulatory Visit: Payer: Self-pay

## 2020-10-30 ENCOUNTER — Ambulatory Visit: Payer: 59 | Admitting: Physical Therapy

## 2020-10-30 DIAGNOSIS — M25561 Pain in right knee: Secondary | ICD-10-CM

## 2020-10-30 DIAGNOSIS — G8929 Other chronic pain: Secondary | ICD-10-CM

## 2020-10-30 DIAGNOSIS — M6281 Muscle weakness (generalized): Secondary | ICD-10-CM | POA: Diagnosis not present

## 2020-10-30 DIAGNOSIS — R600 Localized edema: Secondary | ICD-10-CM | POA: Diagnosis not present

## 2020-10-30 DIAGNOSIS — R262 Difficulty in walking, not elsewhere classified: Secondary | ICD-10-CM

## 2020-10-30 NOTE — Therapy (Signed)
[facility] Physical Therapy [address] [place], [place], [address] Phone: [phone]   Fax:  [phone]  Physical Therapy Treatment  Patient Details  Name: Richard Reyes MRN: [id number] Date of Birth: May 11, 1965 Referring Provider (PT): Georgina Snell, MD   Encounter Date: 10/30/2020   PT End of Session - 10/30/20 1603     Visit Number 2    Number of Visits 12    Date for PT Re-Evaluation 11/27/20    Authorization Type UHC    PT Start Time 1512    PT Stop Time 1550    PT Time Calculation (min) 38 min    Activity Tolerance Patient tolerated treatment well    Behavior During Therapy WFL for tasks assessed/performed             Past Medical History:  Diagnosis Date   Allergy    Arthritis    bilat knee    Asthma    exercised induced   Insomnia w/ sleep apnea    OSA on CPAP    Rhinitis, allergic    Sleep apnea    wears cpap     Past Surgical History:  Procedure Laterality Date   HEMORRHOID SURGERY N/A 10/16/2013   Procedure: EXCISION OF EXTERNAL HEMORRHOIDS X 2 AND ANAL EXAM UNDER ANESTHESIA;  Surgeon: Gayland Curry, MD;  Location: [place];  Service: General;  Laterality: N/A;   NOSE SURGERY      There were no vitals filed for this visit.   Subjective Assessment - 10/30/20 1540     Subjective relays compliance with HEP but does want to have his form checked and that he gets off balance with SL deadlift. Pain is overall a little better, feels the ionto patch helped some    Pertinent History Rt menisectomy 2019    How long can you sit comfortably? depends    How long can you stand comfortably? depends    Diagnostic tests knee MRI IMPRESSION:1. New focal synovitis anterior to the transverse meniscal ligament and somewhat continuous with the infrapatellar plica. This could reflect infrapatellar plical injury. 2. Moderate-sized Baker's cyst. 3. Generally mild osteoarthritis.4. Continued oblique grade 3 signal in the posterior horn  medial meniscus extending to the inferior surface, the patient has had prior debridement and the appearance is unchanged from prior, and  probably postoperative signal rather than recurrent tear.    Patient Stated Goals reduce pain and get back to being active    Pain Onset More than a month ago                               [facility] Adult PT Treatment/Exercise - 10/30/20 0001       Exercises   Exercises Knee/Hip      Knee/Hip Exercises: Aerobic   Recumbent Bike L5 X5 min      Knee/Hip Exercises: Machines for Strengthening   Cybex Knee Extension up with both down with Rt 20# 2X10 (stopped at 2 sets as was starting to get pain with this)    Cybex Leg Press DL 125# 2X15, then Rt leg 75# 2X15      Knee/Hip Exercises: Standing   Other Standing Knee Exercises Single leg RDL 10# X10 on Rt    Other Standing Knee Exercises squats to chair touch with Rt leg a little further behind X10      Knee/Hip Exercises: Seated   Other Seated Knee/Hip Exercises heel taps X10 alternating  bilat      Iontophoresis   Type of Iontophoresis Dexamethasone    Location Rt knee    Dose 1.0 CC    Time 4-6 hour wear home patch      Manual Therapy   Manual therapy comments Rt knee patella mobs medial-lateral, and inf-sup. Manual H.S and quad stretching 30 sec X3 on Rt                          PT Long Term Goals - 10/16/20 0726       PT LONG TERM GOAL #1   Title Pt will be I and compliant with HEP. (target for all goals 6 weeks 11/27/20)    Status New    Target Date 11/27/20      PT LONG TERM GOAL #2   Title Pt will improve Rt leg strength and Rt knee ROM equal to Lt leg strength for functional symmetry.    Status New      PT LONG TERM GOAL #3   Title Pt will report overall less than 2-3/10 pain with usual activity, recreation and ADL's    Status New      PT LONG TERM GOAL #4   Title Pt will improve FOTO to at least 67% functional    Status New                    Plan - 10/30/20 1604     Clinical Impression Statement We reviewed his exercises, checked his form and answered the questions about he had requarding them. We then worked on patella mobility and overall knee strength today to his tolerance. Applied Ionto patch #2 to decreased overall inflammation and pain. He will miss next couple of weeks due to being on vacation and he will follow up as needed.    Personal Factors and Comorbidities Comorbidity 2    Comorbidities asthma, insomnia, knee scope    Examination-Participation Restrictions Cleaning;Occupation;Yard Work;Shop    Stability/Clinical Decision Making Stable/Uncomplicated    Rehab Potential Good    PT Frequency 2x / week    PT Duration 6 weeks    PT Treatment/Interventions Cryotherapy;Electrical Stimulation;Iontophoresis 4mg /ml Dexamethasone;Moist Heat;Ultrasound;Gait training;Stair training;Therapeutic activities;Therapeutic exercise;Balance training;Neuromuscular re-education;Manual techniques;Vasopneumatic Device;Taping;Dry needling;Passive range of motion    PT Next Visit Plan how was hiking? how was ionto?    PT Home Exercise Plan Access Code: [email or web address]             Patient will benefit from skilled therapeutic intervention in order to improve the following deficits and impairments:  Decreased activity tolerance, Decreased endurance, Decreased range of motion, Decreased strength, Difficulty walking, Increased edema, Pain  Visit Diagnosis: Chronic pain of right knee  Muscle weakness (generalized)  Difficulty in walking, not elsewhere classified  Localized edema     Problem List Patient Active Problem List   Diagnosis Date Noted   Spider vein of right lower extremity 08/03/2019   Chronic venous insufficiency 08/03/2019   Asthma 12/17/2015   Class 2 obesity due to excess calories with serious comorbidity and body mass index (BMI) of 35.0 to 35.9 in adult 12/17/2015   Hepatic steatosis 12/17/2015   S/P  hemorrhoidectomy 01/17/2014   Hemorrhoids 11/08/2013   Acne vulgaris 09/27/2013   OSA on CPAP 07/17/2013   Insomnia with sleep apnea 07/17/2013   Hypersomnia 06/02/2013   BACK PAIN, LUMBAR 04/16/2008   OBSTRUCTIVE SLEEP APNEA 11/15/2007   OTHER NONSPECIFIC ABNORMAL SERUM ENZYME LEVELS 10/19/2007  DEPRESSION 10/09/2007   TESTOSTERONE DEFICIENCY 10/06/2007   PURE HYPERCHOLESTEROLEMIA 10/06/2007   INSOMNIA 10/06/2007    Debbe Odea, PT,DPT 10/30/2020, 4:08 PM  [facility] Physical Therapy [address] [place], [place], [address] Phone: [phone]   Fax:  [phone]  Name: Richard Reyes MRN: [id number] Date of Birth: Sep 22, 1965

## 2020-11-12 ENCOUNTER — Ambulatory Visit: Payer: 59 | Admitting: Family Medicine

## 2020-11-12 NOTE — Progress Notes (Deleted)
   I, Peterson Lombard, LAT, ATC acting as a scribe for Lynne Leader, MD.  Richard Reyes is a 55 y.o. male who presents to [place] at [facility] today for f/u R knee pain thought to be due to the anterior synovitis seen on MRI. Pt was previously seen by Dr. Mardelle Matte who dx him w/ a meniscus tear and had a menisectomy and he's had multiple steroid and hyaluronic injections at [facility]. Pt was last seen by Dr. Georgina Snell on 10/15/20 and was advised to continue to PT, of which he's completed 2 visits. Pt was also advised on [place] exercise would be recommended and which exercises to avoid. Today, pt report  Dx imaging: 10/06/20 R knee MRI             09/26/20 R & L knee XR  Pertinent review of systems: ***  Relevant historical information: ***   Exam:  There were no vitals taken for this visit. General: Well Developed, well nourished, and in no acute distress.   MSK: ***    Lab and Radiology Results No results found for this or any previous visit (from the past 72 hour(s)). No results found.     Assessment and Plan: 55 y.o. male with ***   PDMP not reviewed this encounter. No orders of the defined types were placed in this encounter.  No orders of the defined types were placed in this encounter.    Discussed warning signs or symptoms. Please see discharge instructions. Patient expresses understanding.   ***

## 2020-11-20 ENCOUNTER — Encounter: Payer: 59 | Admitting: Physical Therapy

## 2020-12-02 ENCOUNTER — Ambulatory Visit: Payer: 59 | Admitting: Physical Therapy

## 2020-12-02 ENCOUNTER — Other Ambulatory Visit: Payer: Self-pay

## 2020-12-02 DIAGNOSIS — G8929 Other chronic pain: Secondary | ICD-10-CM

## 2020-12-02 DIAGNOSIS — R600 Localized edema: Secondary | ICD-10-CM | POA: Diagnosis not present

## 2020-12-02 DIAGNOSIS — M25561 Pain in right knee: Secondary | ICD-10-CM

## 2020-12-02 DIAGNOSIS — M6281 Muscle weakness (generalized): Secondary | ICD-10-CM | POA: Diagnosis not present

## 2020-12-02 DIAGNOSIS — R262 Difficulty in walking, not elsewhere classified: Secondary | ICD-10-CM | POA: Diagnosis not present

## 2020-12-02 NOTE — Therapy (Addendum)
[facility] Physical Therapy [address] [place], [place], [address] Phone: [phone]   Fax:  [phone]  Physical Therapy Treatment/Discharge addendum  PHYSICAL THERAPY DISCHARGE SUMMARY  Visits from Start of Care: 3  Current functional level related to goals / functional outcomes: See below   Remaining deficits: See below   Education / Equipment: HEP Plan: Patient goals were not met. Patient is being discharged due to not returning since last visit.      Patient Details  Name: Richard Reyes MRN: [id number] Date of Birth: 04-Mar-1965 Referring Provider (PT): Georgina Snell, MD   Encounter Date: 12/02/2020   PT End of Session - 12/02/20 2155     Visit Number 3    Number of Visits 12    Date for PT Re-Evaluation 11/27/20    Authorization Type UHC    PT Start Time 1526    PT Stop Time 1550    PT Time Calculation (min) 24 min    Activity Tolerance Patient tolerated treatment well    Behavior During Therapy WFL for tasks assessed/performed             Past Medical History:  Diagnosis Date   Allergy    Arthritis    bilat knee    Asthma    exercised induced   Insomnia w/ sleep apnea    OSA on CPAP    Rhinitis, allergic    Sleep apnea    wears cpap     Past Surgical History:  Procedure Laterality Date   HEMORRHOID SURGERY N/A 10/16/2013   Procedure: EXCISION OF EXTERNAL HEMORRHOIDS X 2 AND ANAL EXAM UNDER ANESTHESIA;  Surgeon: Gayland Curry, MD;  Location: [place];  Service: General;  Laterality: N/A;   NOSE SURGERY      There were no vitals filed for this visit.   Subjective Assessment - 12/02/20 2150     Subjective relays his knee is really aggravated after hiking and helping someone move over the weekend. He describes his pain more located to his patella tendon area and rates 8/10 pain    Pertinent History Rt menisectomy 2019    How long can you sit comfortably? depends    How long can you stand comfortably?  depends    Diagnostic tests knee MRI IMPRESSION:1. New focal synovitis anterior to the transverse meniscal ligament and somewhat continuous with the infrapatellar plica. This could reflect infrapatellar plical injury. 2. Moderate-sized Baker's cyst. 3. Generally mild osteoarthritis.4. Continued oblique grade 3 signal in the posterior horn medial meniscus extending to the inferior surface, the patient has had prior debridement and the appearance is unchanged from prior, and  probably postoperative signal rather than recurrent tear.    Patient Stated Goals reduce pain and get back to being active    Pain Onset More than a month ago                               [facility] Adult PT Treatment/Exercise - 12/02/20 0001       Modalities   Modalities Ultrasound      Ultrasound   Ultrasound Location Rt anterior knee    Ultrasound Parameters 1.2 W/Cm2, 62mz, 100% X 10 min with biofreeze    Ultrasound Goals Pain      Iontophoresis   Type of Iontophoresis Dexamethasone    Location Rt knee    Dose 1.0 CC    Time 4-6 hour wear home patch  Manual Therapy   Manual therapy comments Rt knee patella mobs                          PT Long Term Goals - 10/16/20 0726       PT LONG TERM GOAL #1   Title Pt will be I and compliant with HEP. (target for all goals 6 weeks 11/27/20)    Status New    Target Date 11/27/20      PT LONG TERM GOAL #2   Title Pt will improve Rt leg strength and Rt knee ROM equal to Lt leg strength for functional symmetry.    Status New      PT LONG TERM GOAL #3   Title Pt will report overall less than 2-3/10 pain with usual activity, recreation and ADL's    Status New      PT LONG TERM GOAL #4   Title Pt will improve FOTO to at least 67% functional    Status New                   Plan - 12/02/20 2157     Clinical Impression Statement He was very flared up today after hiking and helping someone move, his pain seemed more  consistent with acute patellar tendonitis. He requested to not do exercises and instead we focused on modalaties to reduce his overall pain and inflammation and we discussed using ice vs heat at home. He will back off his weight bearing exercises and only perform his now weight bearing until his pain resolves. He will follow up with PT as needed.    Personal Factors and Comorbidities Comorbidity 2    Comorbidities asthma, insomnia, knee scope    Examination-Participation Restrictions Cleaning;Occupation;Yard Work;Shop    Stability/Clinical Decision Making Stable/Uncomplicated    Rehab Potential Good    PT Frequency 2x / week    PT Duration 6 weeks    PT Treatment/Interventions Cryotherapy;Electrical Stimulation;Iontophoresis 39m/ml Dexamethasone;Moist Heat;Ultrasound;Gait training;Stair training;Therapeutic activities;Therapeutic exercise;Balance training;Neuromuscular re-education;Manual techniques;Vasopneumatic Device;Taping;Dry needling;Passive range of motion    PT Next Visit Plan how is HEP going and revise if needed.    PT Home Exercise Plan Access Code: [email or web address]            Patient will benefit from skilled therapeutic intervention in order to improve the following deficits and impairments:  Decreased activity tolerance, Decreased endurance, Decreased range of motion, Decreased strength, Difficulty walking, Increased edema, Pain  Visit Diagnosis: Chronic pain of right knee  Muscle weakness (generalized)  Difficulty in walking, not elsewhere classified  Localized edema     Problem List Patient Active Problem List   Diagnosis Date Noted   Spider vein of right lower extremity 08/03/2019   Chronic venous insufficiency 08/03/2019   Asthma 12/17/2015   Class 2 obesity due to excess calories with serious comorbidity and body mass index (BMI) of 35.0 to 35.9 in adult 12/17/2015   Hepatic steatosis 12/17/2015   S/P hemorrhoidectomy 01/17/2014   Hemorrhoids 11/08/2013   Acne  vulgaris 09/27/2013   OSA on CPAP 07/17/2013   Insomnia with sleep apnea 07/17/2013   Hypersomnia 06/02/2013   BACK PAIN, LUMBAR 04/16/2008   OBSTRUCTIVE SLEEP APNEA 11/15/2007   OTHER NONSPECIFIC ABNORMAL SERUM ENZYME LEVELS 10/19/2007   DEPRESSION 10/09/2007   TESTOSTERONE DEFICIENCY 10/06/2007   PURE HYPERCHOLESTEROLEMIA 10/06/2007   INSOMNIA 10/06/2007    BDebbe Odea PT,DPT 12/02/2020, 9:59 PM  [facility] Physical Therapy  [address] [place], [place], [address] Phone: [phone]   Fax:  [phone]  Name: Richard Reyes MRN: [id number] Date of Birth: September 08, 1965

## 2020-12-26 ENCOUNTER — Encounter (HOSPITAL_COMMUNITY): Payer: Self-pay | Admitting: Emergency Medicine

## 2020-12-26 ENCOUNTER — Other Ambulatory Visit: Payer: Self-pay

## 2020-12-26 ENCOUNTER — Ambulatory Visit (HOSPITAL_COMMUNITY)
Admission: EM | Admit: 2020-12-26 | Discharge: 2020-12-26 | Disposition: A | Discharge status: Home / Self Care | Payer: 59 | Attending: Emergency Medicine | Admitting: Emergency Medicine

## 2020-12-26 DIAGNOSIS — B349 Viral infection, unspecified: Secondary | ICD-10-CM

## 2020-12-26 DIAGNOSIS — J029 Acute pharyngitis, unspecified: Secondary | ICD-10-CM

## 2020-12-26 LAB — POCT RAPID STREP A, ED / UC: Streptococcus, Group A Screen (Direct): NEGATIVE

## 2020-12-26 MED ORDER — LIDOCAINE VISCOUS HCL 2 % MT SOLN
15.0000 mL | OROMUCOSAL | 0 refills | Status: DC | PRN
Start: 2020-12-26 — End: 2022-08-03

## 2020-12-26 NOTE — ED Provider Notes (Signed)
[place]    CSN: [id number] Arrival date & time: 12/26/20  1148      History   Chief Complaint Chief Complaint  Patient presents with   Sore Throat   Nasal Congestion    HPI Lamond Glantz is a 55 y.o. male.   Patient presents with nasal congestion, postnasal drip and sore throat for 2 days.  Dors is that sore throat has worsened, described as buring sensation.  Painful to swallow.  Tolerating food and liquids.  Has attempted use of honey, minimally helpful. Using CPAP. Endorses recent travel with weather change.possible sick contacts.  Denies fever, chills, body aches, abdominal pain, nausea, vomiting, diarrhea, cough, shortness of breath, wheezing, headaches, ear pain.  Past Medical History:  Diagnosis Date   Allergy    Arthritis    bilat knee    Asthma    exercised induced   Insomnia w/ sleep apnea    OSA on CPAP    Rhinitis, allergic    Sleep apnea    wears cpap     Patient Active Problem List   Diagnosis Date Noted   Spider vein of right lower extremity 08/03/2019   Chronic venous insufficiency 08/03/2019   Asthma 12/17/2015   Class 2 obesity due to excess calories with serious comorbidity and body mass index (BMI) of 35.0 to 35.9 in adult 12/17/2015   Hepatic steatosis 12/17/2015   S/P hemorrhoidectomy 01/17/2014   Hemorrhoids 11/08/2013   Acne vulgaris 09/27/2013   OSA on CPAP 07/17/2013   Insomnia with sleep apnea 07/17/2013   Hypersomnia 06/02/2013   BACK PAIN, LUMBAR 04/16/2008   OBSTRUCTIVE SLEEP APNEA 11/15/2007   OTHER NONSPECIFIC ABNORMAL SERUM ENZYME LEVELS 10/19/2007   DEPRESSION 10/09/2007   TESTOSTERONE DEFICIENCY 10/06/2007   PURE HYPERCHOLESTEROLEMIA 10/06/2007   INSOMNIA 10/06/2007    Past Surgical History:  Procedure Laterality Date   HEMORRHOID SURGERY N/A 10/16/2013   Procedure: EXCISION OF EXTERNAL HEMORRHOIDS X 2 AND ANAL EXAM UNDER ANESTHESIA;  Surgeon: Gayland Curry, MD;  Location: [facility];   Service: General;  Laterality: N/A;   NOSE SURGERY         Home Medications    Prior to Admission medications   Medication Sig Start Date End Date Taking? Authorizing Provider  albuterol (PROAIR HFA) 108 (90 Base) MCG/ACT inhaler Inhale 2 puffs into the lungs every 4 (four) hours as needed for wheezing or shortness of breath. Patient not taking: No sig reported 12/21/16   Tenna Delaine D, PA-C  Doxepin HCl 3 MG TABS TAKE 1 TO 2 TABLETS BY MOUTH EVERY NIGHT AT BEDTIME AS NEEDED FOR SLEEP 06/28/19   [provider]  lidocaine-prilocaine (EMLA) cream Apply 1 application topically as needed. 30 to 60 minutes prior to doctor visit 05/14/16   Shawnee Knapp, MD  Multiple Vitamin (MULTIVITAMIN) tablet Take 1 tablet by mouth daily.    [provider]    Family History Family History  Problem Relation Age of Onset   Cancer Maternal Grandmother    Colon polyps Maternal Aunt    Colon polyps Cousin    Colon cancer Neg Hx    Esophageal cancer Neg Hx    Rectal cancer Neg Hx    Stomach cancer Neg Hx     Social History Social History   Tobacco Use   Smoking status: Former    Types: Cigarettes    Quit date: 05/18/2007    Years since quitting: 13.6   Smokeless tobacco: Never  Vaping  Use   Vaping Use: Never used  Substance Use Topics   Alcohol use: Yes    Alcohol/week: 0.0 standard drinks    Comment: rarely   Drug use: No     Allergies   Sulfonamide derivatives   Review of Systems Review of Systems  Constitutional: Negative.   HENT:  Positive for congestion, postnasal drip and sore throat. Negative for dental problem, drooling, ear discharge, ear pain, facial swelling, hearing loss, mouth sores, nosebleeds, rhinorrhea, sinus pressure, sinus pain, sneezing, tinnitus, trouble swallowing and voice change.   Cardiovascular: Negative.   Gastrointestinal: Negative.   Skin: Negative.   Neurological: Negative.     Physical Exam Triage Vital Signs ED Triage Vitals   Enc Vitals Group     BP 12/26/20 1235 (!) 151/81     Pulse Rate 12/26/20 1235 100     Resp 12/26/20 1235 17     Temp 12/26/20 1235 98.1 F (36.7 C)     Temp Source 12/26/20 1235 Oral     SpO2 12/26/20 1235 98 %     Weight --      Height --      Head Circumference --      Peak Flow --      Pain Score 12/26/20 1233 7     Pain Loc --      Pain Edu? --      Excl. in [place]? --    No data found.  Updated Vital Signs BP (!) 151/81 (BP Location: Left Arm)    Pulse 100    Temp 98.1 F (36.7 C) (Oral)    Resp 17    SpO2 98%   Visual Acuity Right Eye Distance:   Left Eye Distance:   Bilateral Distance:    Right Eye Near:   Left Eye Near:    Bilateral Near:     Physical Exam Constitutional:      Appearance: Normal appearance. He is normal weight.  HENT:     Head: Normocephalic.     Right Ear: Tympanic membrane, ear canal and external ear normal.     Left Ear: Tympanic membrane, ear canal and external ear normal.     Nose: Congestion present. No rhinorrhea.     Mouth/Throat:     Mouth: Mucous membranes are moist.     Pharynx: Posterior oropharyngeal erythema present.     Tonsils: 2+ on the right. 2+ on the left.  Eyes:     Extraocular Movements: Extraocular movements intact.  Cardiovascular:     Rate and Rhythm: Normal rate and regular rhythm.     Pulses: Normal pulses.     Heart sounds: Normal heart sounds.  Pulmonary:     Effort: Pulmonary effort is normal.     Breath sounds: Normal breath sounds.  Musculoskeletal:     Cervical back: Normal range of motion and neck supple.  Skin:    General: Skin is warm and dry.  Neurological:     Mental Status: [name], place, and time. Mental status is at baseline.  Psychiatric:        Mood and Affect: Mood normal.        Behavior: Behavior normal.     UC Treatments / Results  Labs (all labs ordered are listed, but only abnormal results are displayed) Labs Reviewed - No data to  display  EKG   Radiology No results found.  Procedures Procedures (including critical care time)  Medications Ordered in UC  Medications - No data to display  Initial Impression / Assessment and Plan / UC Course  I have reviewed the triage vital signs and the nursing notes.  Pertinent labs & imaging results that were available during my care of the patient were reviewed by me and considered in my medical decision making (see chart for details).  Viral illness  Sore throat  1.  Rapid strep negative, sent for culture 2.  Lidocaine viscous 2% 15 mils every 4 hours as needed 3.  Over-the-counter medications for pain management, recommended salt water gargles, honey, warm tea and throat lozenges for additional comfort 4.  Urgent care follow-up as needed Final Clinical Impressions(s) / UC Diagnoses   Final diagnoses:  None   Discharge Instructions   None    ED Prescriptions   None    PDMP not reviewed this encounter.   Hans Eden, NP 12/26/20 1318

## 2020-12-26 NOTE — ED Triage Notes (Signed)
Pt presents with sore throat and nasal drainage xs 2-3 days.

## 2020-12-26 NOTE — Discharge Instructions (Signed)
Your rapid strep test today was negative, your throat sample has been sent to the lab to see if it will grow bacteria, if this occurs you will be notified and antibiotic be sent in for use  In the meantime we must treat this as a viral symptom and manage the symptoms  You may gargle and spit lidocaine solution every 4 hours as needed for temporary relief of your sore throat  May use ibuprofen every 8 hours as needed in addition to Tylenol for additional comfort  You may follow-up at urgent care as needed

## 2021-01-07 ENCOUNTER — Encounter: Payer: 59 | Admitting: Physical Therapy

## 2021-03-25 ENCOUNTER — Telehealth: Payer: Self-pay | Admitting: Neurology

## 2021-03-25 NOTE — Telephone Encounter (Signed)
Pt called stating that he was a pt here 5 yrs ago and in December he saw his PCP Shawnee Knapp, MD and informed her that he would like to try the mandibular device instead of the cpap machine. She informed him that he would need a Sleep Study done again. Before pt was able to get referred the office of his PCP has closed and Brigitte Pulse Laurey Arrow, MD has not settled in another practice as of yet. Pt would like to know what process can he take to be able to get a sleep study here. Please advise. ?

## 2021-03-25 NOTE — Telephone Encounter (Signed)
Can you please reach out to pt to explain referral process? Thank you ?

## 2021-03-26 ENCOUNTER — Telehealth: Payer: Self-pay | Admitting: *Deleted

## 2021-03-26 NOTE — Telephone Encounter (Signed)
I called the pt not able to leave a message. Pt needs to sign a release form. Pt records are ready for pick up.   ?

## 2022-01-30 ENCOUNTER — Telehealth: Payer: Self-pay | Admitting: Pulmonary Disease

## 2022-01-30 NOTE — Telephone Encounter (Signed)
This pt has never been seen at our office nor has the pt had any recent imaging. Last imaging has been over 1 year ago.  [facility] and got the correct pt's name from [facility] with [facility]. Closing this encounter and creating a new encounter in the correct pt's chart.

## 2022-01-30 NOTE — Telephone Encounter (Signed)
Call report. Pls call  [facility]    [phone]

## 2022-06-08 ENCOUNTER — Encounter: Payer: Self-pay | Admitting: Gastroenterology

## 2022-07-03 ENCOUNTER — Encounter: Payer: Self-pay | Admitting: Gastroenterology

## 2022-08-03 ENCOUNTER — Ambulatory Visit (AMBULATORY_SURGERY_CENTER): Payer: 59 | Admitting: *Deleted

## 2022-08-03 ENCOUNTER — Telehealth: Payer: Self-pay | Admitting: *Deleted

## 2022-08-03 VITALS — Ht 70.0 in | Wt 219.0 lb

## 2022-08-03 DIAGNOSIS — Z86010 Personal history of colonic polyps: Secondary | ICD-10-CM

## 2022-08-03 MED ORDER — NA SULFATE-K SULFATE-MG SULF 17.5-3.13-1.6 GM/177ML PO SOLN: 1.0000 | Freq: Once | ORAL | 0 refills | Status: AC

## 2022-08-03 NOTE — Progress Notes (Signed)
Pt's name and DOB verified at the beginning of the pre-visit.  Pt denies any difficulty with ambulating,sitting, laying down or rolling side to side Gave both [facility] main # and MD on call # prior to instructions.  No egg or soy allergy known to patient  No issues known to pt with past sedation with any surgeries or procedures Pt denies having issues being intubated Pt has no issues moving head neck or swallowing No FH of Malignant Hyperthermia Pt is not on diet pills Pt is not on home 02  Pt is not on blood thinners  Pt denies issues with constipation  Pt is not on dialysis Pt denise any abnormal heart rhythms  Pt denies any upcoming cardiac testing Pt encouraged to use to use Singlecare or Goodrx to reduce cost  Patient's chart reviewed by Cathlyn Parsons CNRA prior to pre-visit and patient appropriate for the [facility].  Pre-visit completed and red dot placed by patient's name on their procedure day (on provider's schedule).  . Visit by phone Pt states weight is 219 lb Instructed pt why it is important to and  to call if they have any changes in health or new medications. Directed them to the # given and on instructions.   Pt states they will.  Instructions reviewed with pt and pt states understanding. Instructed to review again prior to procedure. Pt states they will.  Instructions sent by mail with coupon and by my chart

## 2022-08-03 NOTE — Telephone Encounter (Signed)
Attempt to reach pt for pre-visit. LM with call back #.  Will attempt to reach again in due to no other # listed in profile

## 2022-08-04 ENCOUNTER — Encounter: Payer: Self-pay | Admitting: Gastroenterology

## 2022-08-20 ENCOUNTER — Telehealth: Payer: Self-pay | Admitting: Gastroenterology

## 2022-08-20 NOTE — Telephone Encounter (Signed)
Patient called states he was on vacation and was stung by a sting ray yesterday has a procedure scheduled for Monday 08/24/22. Please advise

## 2022-08-20 NOTE — Telephone Encounter (Signed)
LM with pt . Gave call back #. Stated on message There is no issue but please call if you have any more questions.

## 2022-08-24 ENCOUNTER — Ambulatory Visit (AMBULATORY_SURGERY_CENTER): Payer: 59 | Admitting: Gastroenterology

## 2022-08-24 ENCOUNTER — Encounter: Payer: Self-pay | Admitting: Gastroenterology

## 2022-08-24 VITALS — BP 131/80 | HR 77 | Temp 99.6°F | Resp 14 | Ht 70.0 in | Wt 219.0 lb

## 2022-08-24 DIAGNOSIS — Z09 Encounter for follow-up examination after completed treatment for conditions other than malignant neoplasm: Secondary | ICD-10-CM

## 2022-08-24 DIAGNOSIS — Z86010 Personal history of colonic polyps: Secondary | ICD-10-CM | POA: Diagnosis not present

## 2022-08-24 DIAGNOSIS — D123 Benign neoplasm of transverse colon: Secondary | ICD-10-CM

## 2022-08-24 DIAGNOSIS — Z800 Family history of malignant neoplasm of digestive organs: Secondary | ICD-10-CM

## 2022-08-24 MED ORDER — SODIUM CHLORIDE 0.9 % IV SOLN: 500.0000 mL | Freq: Once | INTRAVENOUS | Status: DC

## 2022-08-24 NOTE — Progress Notes (Signed)
Sedate, gd SR, tolerated procedure well, VSS, report to RN 

## 2022-08-24 NOTE — Patient Instructions (Addendum)
- Repeat colonoscopy, likely 3-5 years, after                            studies are complete for surveillance based on                            pathology results.                          - High fiber diet.                           - Continue present medications.                           -2  polyps removed and sent to pathology.  Diverticulosis and hemorrhoids. - Repeat colonoscopy, likely 3-5 years, after                            studies are complete for surveillance based on                            pathology results.                           YOU HAD AN ENDOSCOPIC PROCEDURE TODAY AT THE [facility]:   Refer to the procedure report that was given to you for any specific questions about what was found during the examination.  If the procedure report does not answer your questions, please call your gastroenterologist to clarify.  If you requested that your care partner not be given the details of your procedure findings, then the procedure report has been included in a sealed envelope for you to review at your convenience later.  YOU SHOULD EXPECT: Some feelings of bloating in the abdomen. Passage of more gas than usual.  Walking can help get rid of the air that was put into your GI tract during the procedure and reduce the bloating. If you had a lower endoscopy (such as a colonoscopy or flexible sigmoidoscopy) you may notice spotting of blood in your stool or on the toilet paper. If you underwent a bowel prep for your procedure, you may not have a normal bowel movement for a few days.  Please Note:  You might notice some irritation and congestion in your nose or some drainage.  This is from the oxygen used during your procedure.  There is no need for concern and it should clear up in a day or so.  SYMPTOMS TO REPORT IMMEDIATELY:  Following lower endoscopy (colonoscopy or flexible sigmoidoscopy):  Excessive amounts of blood in the stool  Significant  tenderness or worsening of abdominal pains  Swelling of the abdomen that is new, acute  Fever of 100F or higher   For urgent or emergent issues, a gastroenterologist can be reached at any hour by calling [phone]. Do not use MyChart messaging for urgent concerns.    DIET:  We do recommend a small meal at first, but then you may proceed to your regular diet.  Drink plenty of fluids but you should avoid alcoholic beverages for 24 hours.  ACTIVITY:  You should plan to take it easy for  the rest of today and you should NOT DRIVE or use heavy machinery until tomorrow (because of the sedation medicines used during the test).    FOLLOW UP: Our staff will call the number listed on your records the next business day following your procedure.  We will call around 7:15- 8:00 am to check on you and address any questions or concerns that you may have regarding the information given to you following your procedure. If we do not reach you, we will leave a message.     If any biopsies were taken you will be contacted by phone or by letter within the next 1-3 weeks.  Please call us at [phone] if you have not heard about the biopsies in 3 weeks.    SIGNATURES/CONFIDENTIALITY: You and/or your care partner have signed paperwork which will be entered into your electronic medical record.  These signatures attest to the fact that that the information above on your After Visit Summary has been reviewed and is understood.  Full responsibility of the confidentiality of this discharge information lies with you and/or your care-partner.

## 2022-08-24 NOTE — Progress Notes (Signed)
Called to room to assist during endoscopic procedure.  Patient ID and intended procedure confirmed with present staff. Received instructions for my participation in the procedure from the performing physician.  

## 2022-08-24 NOTE — Op Note (Signed)
[facility] Patient Name: Richard Reyes Procedure Date: 08/24/2022 1:51 PM MRN: [id number] Endoscopist: Meryl Dare , MD, [phone] Age: 57 Referring MD:  Date of Birth: Jun 16, 1965 Gender: Male Account #: [id number] Procedure:                Colonoscopy Indications:              Surveillance: Personal history of adenomatous                            polyps on last colonoscopy 3 years ago, Family                            history of colon cancer, 1st-degree relative Medicines:                Monitored Anesthesia Care Procedure:                Pre-Anesthesia Assessment:                           - Prior to the procedure, a History and Physical                            was performed, and patient medications and                            allergies were reviewed. The patient's tolerance of                            previous anesthesia was also reviewed. The risks                            and benefits of the procedure and the sedation                            options and risks were discussed with the patient.                            All questions were answered, and informed consent                            was obtained. Prior Anticoagulants: The patient has                            taken no anticoagulant or antiplatelet agents. ASA                            Grade Assessment: II - A patient with mild systemic                            disease. After reviewing the risks and benefits,                            the patient was deemed in satisfactory condition to  undergo the procedure.                           After obtaining informed consent, the colonoscope                            was passed under direct vision. Throughout the                            procedure, the patient's blood pressure, pulse, and                            oxygen saturations were monitored continuously. The                            Olympus CF-HQ190L  ([id number]) Colonoscope was                            introduced through the anus and advanced to the the                            cecum, identified by appendiceal orifice and                            ileocecal valve. The ileocecal valve, appendiceal                            orifice, and rectum were photographed. The quality                            of the bowel preparation was good. The colonoscopy                            was performed without difficulty. The patient                            tolerated the procedure well. Scope In: 2:22:22 PM Scope Out: 2:33:46 PM Scope Withdrawal Time: 0 hours 9 minutes 35 seconds  Total Procedure Duration: 0 hours 11 minutes 24 seconds  Findings:                 The perianal and digital rectal examinations were                            normal.                           Two sessile polyps were found in the transverse                            colon. The polyps were 5 to 8 mm in size. These                            polyps were removed with a cold snare. Resection  and retrieval were complete.                           Multiple small-mouthed diverticula were found in                            the left colon. There was no evidence of                            diverticular bleeding.                           External hemorrhoids were found during                            retroflexion. The hemorrhoids were small.                           The exam was otherwise without abnormality on                            direct and retroflexion views. Complications:            No immediate complications. Estimated blood loss:                            None. Estimated Blood Loss:     Estimated blood loss: none. Impression:               - Two 5 to 8 mm polyps in the transverse colon,                            removed with a cold snare. Resected and retrieved.                           - Mild diverticulosis in the left  colon.                           - External hemorrhoids.                           - The examination was otherwise normal on direct                            and retroflexion views. Recommendation:           - Repeat colonoscopy, likely 3-5 years, after                            studies are complete for surveillance based on                            pathology results.                           - Patient has a contact number available for  emergencies. The signs and symptoms of potential                            delayed complications were discussed with the                            patient. Return to normal activities tomorrow.                            Written discharge instructions were provided to the                            patient.                           - High fiber diet.                           - Continue present medications.                           - Await pathology results. Meryl Dare, MD 08/24/2022 2:36:56 PM This report has been signed electronically.

## 2022-08-24 NOTE — Progress Notes (Signed)
Pt's states no medical or surgical changes since previsit or office visit. 

## 2022-08-24 NOTE — Progress Notes (Signed)
History & Physical  Primary Care Physician:  Tisovec, Adelfa Koh, MD Primary Gastroenterologist: Claudette Head, MD  Impression / Plan:  Family history of colon cancer, first-degree relative, and a personal history of multiple adenomatous colon polyps for colonoscopy.  CHIEF COMPLAINT: Family history of colon cancer, personal history of colon polyps   HPI: Richard Reyes is a 57 y.o. male with a Family history of colon cancer, first-degree relative, a personal history of multiple adenomatous colon polyps for colonoscopy.    Past Medical History:  Diagnosis Date   Allergy    Arthritis    bilat knee    Asthma    exercised induced   Hypertension    Insomnia w/ sleep apnea    OSA on CPAP    Rhinitis, allergic    Sleep apnea    wears cpap     Past Surgical History:  Procedure Laterality Date   COLONOSCOPY     HEMORRHOID SURGERY N/A 10/16/2013   Procedure: EXCISION OF EXTERNAL HEMORRHOIDS X 2 AND ANAL EXAM UNDER ANESTHESIA;  Surgeon: Atilano Ina, MD;  Location: [facility];  Service: General;  Laterality: N/A;   menuscus     NOSE SURGERY      Prior to Admission medications   Medication Sig Start Date End Date Taking? Authorizing Provider  clotrimazole-betamethasone (LOTRISONE) cream Apply 1 Application topically 2 (two) times daily.   Yes [provider]  doxycycline (ADOXA) 75 MG tablet Take 75 mg by mouth 2 (two) times daily.   Yes [provider]  doxycycline (VIBRAMYCIN) 100 MG capsule Take 100 mg by mouth 2 (two) times daily. 08/14/22  Yes [provider]  fluticasone (FLONASE) 50 MCG/ACT nasal spray Place 2 sprays into both nostrils daily. 08/13/22  Yes [provider]  ipratropium (ATROVENT) 0.03 % nasal spray Place 1 spray into both nostrils 3 (three) times daily.   Yes [provider]  linezolid (ZYVOX) 600 MG tablet Take 600 mg by mouth 2 (two) times daily. 08/01/22  Yes [provider]  loratadine  (CLARITIN) 10 MG tablet Take 1 tablet by mouth daily.   Yes [provider]  metroNIDAZOLE (METROGEL) 0.75 % gel Apply 1 Application topically 2 (two) times daily. 04/23/22  Yes [provider]  montelukast (SINGULAIR) 10 MG tablet Take 10 mg by mouth daily.   Yes [provider]  Multiple Vitamin (MULTIVITAMIN) tablet Take 1 tablet by mouth daily.   Yes [provider]  omega-3 acid ethyl esters (LOVAZA) 1 g capsule Take by mouth 2 (two) times daily.   Yes [provider]  traZODone (DESYREL) 50 MG tablet Take 50 mg by mouth at bedtime as needed.   Yes [provider]  albuterol (PROAIR HFA) 108 (90 Base) MCG/ACT inhaler Inhale 2 puffs into the lungs every 4 (four) hours as needed for wheezing or shortness of breath. 12/21/16   Benjiman Core D, PA-C  Doxepin HCl 3 MG TABS TAKE 1 TO 2 TABLETS BY MOUTH EVERY NIGHT AT BEDTIME AS NEEDED FOR SLEEP Patient not taking: Reported on 08/03/2022 06/28/19   [provider]  lidocaine-prilocaine (EMLA) cream Apply 1 application topically as needed. 30 to 60 minutes prior to doctor visit Patient not taking: Reported on 08/03/2022 05/14/16   Sherren Mocha, MD  nystatin cream (MYCOSTATIN) Apply 1 Application topically 3 (three) times daily. 08/14/22   [provider]  testosterone cypionate (DEPOTESTOSTERONE CYPIONATE) 200 MG/ML injection every 14 (fourteen) days. 08/14/22   [provider]    Current Outpatient Medications  Medication Sig Dispense Refill   clotrimazole-betamethasone (LOTRISONE) cream Apply 1 Application topically 2 (two) times daily.     doxycycline (ADOXA) 75 MG tablet Take 75 mg by mouth 2 (two) times daily.     doxycycline (VIBRAMYCIN) 100 MG capsule Take 100 mg by mouth 2 (two) times daily.     fluticasone (FLONASE) 50 MCG/ACT nasal spray Place 2 sprays into both nostrils daily.     ipratropium (ATROVENT) 0.03 % nasal spray Place 1 spray into both nostrils 3 (three)  times daily.     linezolid (ZYVOX) 600 MG tablet Take 600 mg by mouth 2 (two) times daily.     loratadine (CLARITIN) 10 MG tablet Take 1 tablet by mouth daily.     metroNIDAZOLE (METROGEL) 0.75 % gel Apply 1 Application topically 2 (two) times daily.     montelukast (SINGULAIR) 10 MG tablet Take 10 mg by mouth daily.     Multiple Vitamin (MULTIVITAMIN) tablet Take 1 tablet by mouth daily.     omega-3 acid ethyl esters (LOVAZA) 1 g capsule Take by mouth 2 (two) times daily.     traZODone (DESYREL) 50 MG tablet Take 50 mg by mouth at bedtime as needed.     albuterol (PROAIR HFA) 108 (90 Base) MCG/ACT inhaler Inhale 2 puffs into the lungs every 4 (four) hours as needed for wheezing or shortness of breath. 1 Inhaler 1   Doxepin HCl 3 MG TABS TAKE 1 TO 2 TABLETS BY MOUTH EVERY NIGHT AT BEDTIME AS NEEDED FOR SLEEP (Patient not taking: Reported on 08/03/2022)     lidocaine-prilocaine (EMLA) cream Apply 1 application topically as needed. 30 to 60 minutes prior to doctor visit (Patient not taking: Reported on 08/03/2022) 30 g 2   nystatin cream (MYCOSTATIN) Apply 1 Application topically 3 (three) times daily.     testosterone cypionate (DEPOTESTOSTERONE CYPIONATE) 200 MG/ML injection every 14 (fourteen) days.     Current Facility-Administered Medications  Medication Dose Route Frequency Provider Last Rate Last Admin   0.9 %  sodium chloride infusion  500 mL Intravenous Once Meryl Dare, MD        Allergies as of 08/24/2022 - Review Complete 08/24/2022  Allergen Reaction Noted   Sulfonamide derivatives  09/15/2007    Family History  Problem Relation Age of Onset   Colon cancer Brother    Colon cancer Maternal Aunt    Colon polyps Maternal Aunt    Cancer Maternal Grandmother    Colon polyps Cousin    Esophageal cancer Neg Hx    Rectal cancer Neg Hx    Stomach cancer Neg Hx     Social History   Socioeconomic History   Marital status: Single    Spouse name: Not on file   Number of  children: 0   Years of education: 16   Highest education level: Not on file  Occupational History    Employer: [employer]  Tobacco Use   Smoking status: Former    Current packs/day: 0.00    Types: Cigarettes    Quit date: 05/18/2007    Years since quitting: 15.2   Smokeless tobacco: Never  Vaping Use   Vaping status: Never Used  Substance and Sexual Activity   Alcohol use: Yes    Alcohol/week: 0.0 standard drinks of alcohol    Comment: rarely   Drug use: No   Sexual activity: Not Currently  Other Topics Concern   Not on  file  Social History Narrative   Patient is single and lives alone.   Patient has a college education.   Patient is working full time.   Patient is right-handed.   Patient does not drink any caffeine.   Social Determinants of Health   Financial Resource Strain: Not on file  Food Insecurity: Not on file  Transportation Needs: Not on file  Physical Activity: Not on file  Stress: Not on file  Social Connections: Not on file  Intimate Partner Violence: Not on file    Review of Systems:  All systems reviewed were negative except where noted in HPI.   Physical Exam:  General:  Alert, well-developed, in NAD Head:  Normocephalic and atraumatic. Eyes:  Sclera clear, no icterus.   Conjunctiva pink. Ears:  Normal auditory acuity. Mouth:  No deformity or lesions.  Neck:  Supple; no masses. Lungs:  Clear throughout to auscultation.   No wheezes, crackles, or rhonchi.  Heart:  Regular rate and rhythm; no murmurs. Abdomen:  Soft, nondistended, nontender. No masses, hepatomegaly. No palpable masses.  Normal bowel sounds.    Rectal:  Deferred   Msk:  Symmetrical without gross deformities. Extremities:  Without edema. Neurologic:  Alert and  oriented x 4; grossly normal neurologically. Skin:  Intact without significant lesions or rashes. Psych:  Alert and cooperative. Normal mood and affect.   Venita Lick. Russella Dar  08/24/2022, 2:02 PM See Loretha Stapler, [facility], to contact our on call provider

## 2022-08-25 ENCOUNTER — Telehealth: Payer: Self-pay

## 2022-08-25 NOTE — Telephone Encounter (Signed)
Attempted to reach patient for post-procedure f/u call. No answer. Left message for him to please not hesitate to call if he has any questions/concerns regarding his care. 

## 2022-09-01 ENCOUNTER — Encounter: Payer: Self-pay | Admitting: Gastroenterology

## 2022-11-18 ENCOUNTER — Other Ambulatory Visit: Payer: Self-pay

## 2022-11-18 ENCOUNTER — Encounter: Payer: Self-pay | Admitting: Allergy

## 2022-11-18 ENCOUNTER — Ambulatory Visit: Payer: 59 | Admitting: Allergy

## 2022-11-18 VITALS — BP 120/78 | HR 91 | Temp 98.8°F | Resp 12 | Ht 68.9 in | Wt 218.8 lb

## 2022-11-18 DIAGNOSIS — J310 Chronic rhinitis: Secondary | ICD-10-CM

## 2022-11-18 DIAGNOSIS — T781XXD Other adverse food reactions, not elsewhere classified, subsequent encounter: Secondary | ICD-10-CM

## 2022-11-18 NOTE — Patient Instructions (Signed)
Allergic Rhinitis Nasal congestion, postnasal drainage, and voice changes.  -Continue Flonase 2 sprays each nostril daily.  With using nasal sprays point tip of bottle toward eye on same side nostril and lean head slightly forward for best technique.   -Continue nasal saline rinse with navage device before use of nasal spray. -Change loratadine to Zyrtec 10mg  or Allegra 180mg  for more effective symptom control. -Use Montelukast 10mg  daily as it is an anti-leukotriene and works best with consistent use. -Schedule allergy skin testing to identify specific allergens. Stop antihistamine three days before testing but continue montelukast.  Potential Mushroom Allergy -Continue avoidance and will plan to test for this with environmental allergy testing  Schedule skin testing visit and hold antihistamines for 3 days prior.

## 2022-11-18 NOTE — Progress Notes (Signed)
New Patient Note  RE: Richard Reyes MRN: [id number] DOB: 10/27/1965 Date of Office Visit: 11/18/2022   Primary care provider: Gaspar Garbe, MD  Chief Complaint: allergies  History of present illness: Richard Reyes is a 57 y.o. male presenting today for evaluation of allergic rhinitis.   Discussed the use of AI scribe software for clinical note transcription with the patient, who gave verbal consent to proceed.    The patient, with a history of recent nasal surgery, presents with persistent nasal congestion and post-nasal drainage. He reports a brief period of relief postoperatively, but symptoms have since returned, particularly after exposure to dust. The patient also notes voice changes and mouth breathing during sleep due to nasal obstruction. He has been using Afrin and Flonase as prescribed postoperatively, along with saline rinses.  The patient has a history of allergies, previously tested around 30 years ago, with positive results to multiple allergens. He has been on montelukast and loratadine for the past two allergy seasons, but only takes them when symptoms flare up. He also reports exercise-induced asthma, but no longer engages in running due to ankle and knee injuries.  In addition to nasal symptoms, the patient has a history of eczema, psoriasis, and rosacea, managed by a dermatologist. He also reports a single allergic reaction to mushrooms in his late twenties and has since avoided them.  Review of systems: 10pt ROS negative unless noted above in HPI  All other systems negative unless noted above in HPI  Past medical history: Past Medical History:  Diagnosis Date   Allergy    Arthritis    bilat knee    Asthma    exercised induced   Eczema    Hypertension    Insomnia w/ sleep apnea    OSA on CPAP    Rhinitis, allergic    Sleep apnea    wears cpap     Past surgical history: Past Surgical History:  Procedure Laterality Date   COLONOSCOPY      HEMORRHOID SURGERY N/A 10/16/2013   Procedure: EXCISION OF EXTERNAL HEMORRHOIDS X 2 AND ANAL EXAM UNDER ANESTHESIA;  Surgeon: Atilano Ina, MD;  Location: [facility];  Service: General;  Laterality: N/A;   menuscus     NOSE SURGERY     SINOSCOPY      Family history:  Family History  Problem Relation Age of Onset   Allergic rhinitis Sister    Allergic rhinitis Brother    Colon cancer Brother    Colon cancer Maternal Aunt    Colon polyps Maternal Aunt    Eczema Maternal Grandmother    Cancer Maternal Grandmother    Eczema Paternal Grandmother    Colon polyps Cousin    Esophageal cancer Neg Hx    Rectal cancer Neg Hx    Stomach cancer Neg Hx     Social history: Lives in a home without carpeting with central cooling.  No concern for roaches in the home.  No pets in the home.  [place] has cats, dogs, chickens, ducks and cows.  He is a Merchandiser, retail.  He denies a current smoking history.   Medication List: Current Outpatient Medications  Medication Sig Dispense Refill   albuterol (PROAIR HFA) 108 (90 Base) MCG/ACT inhaler Inhale 2 puffs into the lungs every 4 (four) hours as needed for wheezing or shortness of breath. 1 Inhaler 1   doxycycline (ADOXA) 75 MG tablet Take  75 mg by mouth 2 (two) times daily.     fluticasone (FLONASE) 50 MCG/ACT nasal spray Place 2 sprays into both nostrils daily.     loratadine (CLARITIN) 10 MG tablet Take 1 tablet by mouth daily.     metroNIDAZOLE (METROGEL) 0.75 % gel Apply 1 Application topically 2 (two) times daily.     montelukast (SINGULAIR) 10 MG tablet Take 10 mg by mouth daily.     Multiple Vitamin (MULTIVITAMIN) tablet Take 1 tablet by mouth daily.     omega-3 acid ethyl esters (LOVAZA) 1 g capsule Take by mouth 2 (two) times daily.     testosterone cypionate (DEPOTESTOSTERONE CYPIONATE) 200 MG/ML injection every 14 (fourteen) days.     traZODone (DESYREL) 50 MG tablet  Take 50 mg by mouth at bedtime as needed.     tretinoin (RETIN-A) 0.05 % cream Apply topically at bedtime.     clotrimazole-betamethasone (LOTRISONE) cream Apply 1 Application topically 2 (two) times daily. (Patient not taking: Reported on 11/18/2022)     Doxepin HCl 3 MG TABS TAKE 1 TO 2 TABLETS BY MOUTH EVERY NIGHT AT BEDTIME AS NEEDED FOR SLEEP (Patient not taking: Reported on 08/03/2022)     doxycycline (VIBRAMYCIN) 100 MG capsule Take 100 mg by mouth 2 (two) times daily. (Patient not taking: Reported on 11/18/2022)     ipratropium (ATROVENT) 0.03 % nasal spray Place 1 spray into both nostrils 3 (three) times daily. (Patient not taking: Reported on 11/18/2022)     lidocaine-prilocaine (EMLA) cream Apply 1 application topically as needed. 30 to 60 minutes prior to doctor visit (Patient not taking: Reported on 08/03/2022) 30 g 2   linezolid (ZYVOX) 600 MG tablet Take 600 mg by mouth 2 (two) times daily. (Patient not taking: Reported on 11/18/2022)     nystatin cream (MYCOSTATIN) Apply 1 Application topically 3 (three) times daily. (Patient not taking: Reported on 11/18/2022)     No current facility-administered medications for this visit.    Known medication allergies: Allergies  Allergen Reactions   Sulfonamide Derivatives      Physical examination: Blood pressure 120/78, pulse 91, temperature 98.8 F (37.1 C), temperature source Temporal, resp. rate 12, height 5' 8.9" (1.75 m), weight 218 lb 12.8 oz (99.2 kg), SpO2 96%.  General: Alert, interactive, in no acute distress. HEENT: PERRLA, TMs pearly gray, turbinates non-edematous without discharge, post-pharynx non erythematous. Neck: Supple without lymphadenopathy. Lungs: Clear to auscultation without wheezing, rhonchi or rales. {no increased work of breathing. CV: Normal S1, S2 without murmurs. Abdomen: Nondistended, nontender. Skin: Warm and dry, without lesions or rashes. Extremities:  No clubbing, cyanosis or edema. Neuro:    Grossly intact.  Diagnositics/Labs: None today  Assessment and plan: Rhinitis, presumed allergic Nasal congestion, postnasal drainage, and voice changes.  -Continue Flonase 2 sprays each nostril daily.  With using nasal sprays point tip of bottle toward eye on same side nostril and lean head slightly forward for best technique.   -Continue nasal saline rinse with navage device before use of nasal spray. -Change loratadine to Zyrtec 10mg  or Allegra 180mg  for more effective symptom control. -Use Montelukast 10mg  daily as it is an anti-leukotriene and works best with consistent use. -Schedule allergy skin testing to identify specific allergens. Stop antihistamine three days before testing but continue montelukast.  Mushroom Allergy -Continue avoidance and will plan to test for this with environmental allergy testing  Schedule skin testing visit and hold antihistamines for 3 days prior.    I appreciate the opportunity to  take part in Brandi's care. Please do not hesitate to contact me with questions.  Sincerely,   Margo Aye, MD Allergy/Immunology [facility]

## 2022-11-25 ENCOUNTER — Ambulatory Visit: Payer: 59 | Admitting: Allergy

## 2022-11-25 ENCOUNTER — Encounter: Payer: Self-pay | Admitting: Allergy

## 2022-11-25 DIAGNOSIS — T781XXD Other adverse food reactions, not elsewhere classified, subsequent encounter: Secondary | ICD-10-CM

## 2022-11-25 DIAGNOSIS — J3089 Other allergic rhinitis: Secondary | ICD-10-CM

## 2022-11-25 DIAGNOSIS — J309 Allergic rhinitis, unspecified: Secondary | ICD-10-CM

## 2022-11-25 DIAGNOSIS — J302 Other seasonal allergic rhinitis: Secondary | ICD-10-CM

## 2022-11-25 NOTE — Patient Instructions (Signed)
Allergic Rhinitis Nasal congestion, postnasal drainage, and voice changes.  -Continue Flonase 2 sprays each nostril daily.  With using nasal sprays point tip of bottle toward eye on same side nostril and lean head slightly forward for best technique.   -Continue nasal saline rinse with navage device before use of nasal spray. -Use Zyrtec 10mg  or Allegra 180mg  for more effective symptom control. -Use Montelukast 10mg  daily as it is an anti-leukotriene and works best with consistent use.  - Testing today showed: weeds, trees, indoor molds, outdoor molds, dust mites, cat, and tobacco leaf, feathers (ie down) - Copy of test results provided.  - Avoidance measures provided. - Consider allergy shots as a means of long-term control. - Allergy shots "re-train" and "reset" the immune system to ignore environmental allergens and decrease the resulting immune response to those allergens (sneezing, itchy watery eyes, runny nose, nasal congestion, etc).    - Allergy shots improve symptoms in 75-85% of patients.  - We can discuss more at a future appointment if the medications are not working for you.   Potential Mushroom Allergy -Skin testing for mushroom is negative -If interested in eating again we can perform an in-office mushroom challenge if more comfortable performing in office  Follow-up in 4-6 months or sooner if needed

## 2022-11-25 NOTE — Progress Notes (Signed)
Follow-up Note  RE: Richard Reyes MRN: [id number] DOB: [birth year] Date of Office Visit: 11/25/2022   History of present illness: Richard Reyes is a 57 y.o. male presenting today for skin testing visit.  He was last seen in the office on 11/18/22 by myself.  He has not had any antihistamines in the past 3 days at least.  He is in his usual state of health.    Medication List: Current Outpatient Medications  Medication Sig Dispense Refill   albuterol (PROAIR HFA) 108 (90 Base) MCG/ACT inhaler Inhale 2 puffs into the lungs every 4 (four) hours as needed for wheezing or shortness of breath. 1 Inhaler 1   clotrimazole-betamethasone (LOTRISONE) cream Apply 1 Application topically 2 (two) times daily. (Patient not taking: Reported on 11/18/2022)     doxycycline (ADOXA) 75 MG tablet Take 75 mg by mouth 2 (two) times daily.     fluticasone (FLONASE) 50 MCG/ACT nasal spray Place 2 sprays into both nostrils daily.     ipratropium (ATROVENT) 0.03 % nasal spray Place 1 spray into both nostrils 3 (three) times daily. (Patient not taking: Reported on 11/18/2022)     lidocaine-prilocaine (EMLA) cream Apply 1 application topically as needed. 30 to 60 minutes prior to doctor visit (Patient not taking: Reported on 08/03/2022) 30 g 2   linezolid (ZYVOX) 600 MG tablet Take 600 mg by mouth 2 (two) times daily. (Patient not taking: Reported on 11/18/2022)     loratadine (CLARITIN) 10 MG tablet Take 1 tablet by mouth daily.     metroNIDAZOLE (METROGEL) 0.75 % gel Apply 1 Application topically 2 (two) times daily.     montelukast (SINGULAIR) 10 MG tablet Take 10 mg by mouth daily.     Multiple Vitamin (MULTIVITAMIN) tablet Take 1 tablet by mouth daily.     nystatin cream (MYCOSTATIN) Apply 1 Application topically 3 (three) times daily. (Patient not taking: Reported on 11/18/2022)     omega-3 acid ethyl esters (LOVAZA) 1 g capsule Take by mouth 2 (two) times daily.     testosterone cypionate (DEPOTESTOSTERONE  CYPIONATE) 200 MG/ML injection every 14 (fourteen) days.     traZODone (DESYREL) 50 MG tablet Take 50 mg by mouth at bedtime as needed.     tretinoin (RETIN-A) 0.05 % cream Apply topically at bedtime.     No current facility-administered medications for this visit.     Known medication allergies: Allergies  Allergen Reactions   Sulfonamide Derivatives      Physical examination:  General: Alert, interactive, in no acute distress. Skin: Warm and dry, without lesions or rashes. Extremities:  No clubbing, cyanosis or edema. Neuro:   Grossly intact.  Diagnositics/Labs:  Allergy testing:   Airborne Adult Perc - 11/25/22 1432     Time Antigen Placed 0240    Allergen Manufacturer Waynette Buttery    Location Back    Number of Test 55    Panel 1 Select    1. Control-Buffer 50% Glycerol Negative    2. Control-Histamine 2+    3. Bahia Negative    4. French Southern Territories Negative    5. Johnson Negative    6. Kentucky Blue Negative    7. Meadow Fescue Negative    8. Perennial Rye Negative    9. Timothy Negative    10. Ragweed Mix Negative    11. Cocklebur 2+    12. Plantain,  English 2+    13. Baccharis Negative    14. Dog Fennel Negative    15. Guernsey Thistle  Negative    16. Lamb's Quarters Negative    17. Sheep Sorrell Negative    18. Rough Pigweed Negative    19. Marsh Elder, Rough 2+    20. Mugwort, Common 2+    21. Box, Elder 2+    22. Cedar, red 2+    23. Sweet Gum Negative    24. Pecan Pollen Negative    25. Pine Mix Negative    26. Walnut, Black Pollen 2+    27. Red Mulberry Negative    28. Ash Mix 2+    29. Birch Mix 2+    30. Beech American 2+    31. Cottonwood, Guinea-Bissau Negative    32. Hickory, White Negative    33. Maple Mix 2+    34. Oak, Guinea-Bissau Mix Negative    35. Sycamore Eastern Negative    36. Alternaria Alternata 2+    37. Cladosporium Herbarum 2+    38. Aspergillus Mix 2+    39. Penicillium Mix Negative    40. Bipolaris Sorokiniana (Helminthosporium) Negative     41. Drechslera Spicifera (Curvularia) 2+    42. Mucor Plumbeus 2+    43. Fusarium Moniliforme 2+    44. Aureobasidium Pullulans (pullulara) Negative    45. Rhizopus Oryzae 2+    46. Botrytis Cinera Negative    47. Epicoccum Nigrum Negative    48. Phoma Betae Negative    49. Dust Mite Mix 2+    50. Cat Hair 10,000 BAU/ml 2+    51.  Dog Epithelia Negative    52. Mixed Feathers 2+    53. Horse Epithelia Negative    54. Cockroach, German Negative    55. Tobacco Leaf 2+             13 Food Perc - 11/25/22 1432       Test Information   Time Antigen Placed 0240    Allergen Manufacturer Waynette Buttery    Location Back    Number of allergen test 13             Food Adult Perc - 11/25/22 1400     Time Antigen Placed 0240    Allergen Manufacturer Waynette Buttery    Location Back    Number of allergen test 1    46. Mushrooms Negative             Allergy testing results were read and interpreted by provider, documented by clinical staff.   Assessment and plan: Allergic Rhinitis Nasal congestion, postnasal drainage, and voice changes.  -Continue Flonase 2 sprays each nostril daily.  With using nasal sprays point tip of bottle toward eye on same side nostril and lean head slightly forward for best technique.   -Continue nasal saline rinse with navage device before use of nasal spray. -Use Zyrtec 10mg  or Allegra 180mg  for more effective symptom control. -Use Montelukast 10mg  daily as it is an anti-leukotriene and works best with consistent use.  - Testing today showed: weeds, trees, indoor molds, outdoor molds, dust mites, cat, and tobacco leaf, feathers (ie down) - Copy of test results provided.  - Avoidance measures provided. - Consider allergy shots as a means of long-term control. - Allergy shots "re-train" and "reset" the immune system to ignore environmental allergens and decrease the resulting immune response to those allergens (sneezing, itchy watery eyes, runny nose, nasal  congestion, etc).    - Allergy shots improve symptoms in 75-85% of patients.  - We can discuss more at a future appointment if  the medications are not working for you.  Mushroom Allergy -Skin testing for mushroom is negative -If interested in eating again we can perform an in-office mushroom challenge if more comfortable performing in office  Follow-up in 4-6 months or sooner if needed  I appreciate the opportunity to take part in [place]'s care. Please do not hesitate to contact me with questions.  Sincerely,   Margo Aye, MD Allergy/Immunology [facility]

## 2023-01-29 ENCOUNTER — Other Ambulatory Visit (HOSPITAL_BASED_OUTPATIENT_CLINIC_OR_DEPARTMENT_OTHER): Payer: Self-pay

## 2023-03-25 ENCOUNTER — Ambulatory Visit: Payer: 59 | Admitting: Allergy

## 2023-03-25 ENCOUNTER — Encounter: Payer: Self-pay | Admitting: Allergy

## 2023-03-25 ENCOUNTER — Other Ambulatory Visit: Payer: Self-pay

## 2023-03-25 VITALS — BP 134/76 | HR 63 | Temp 98.3°F | Ht 69.0 in | Wt 219.5 lb

## 2023-03-25 DIAGNOSIS — T781XXD Other adverse food reactions, not elsewhere classified, subsequent encounter: Secondary | ICD-10-CM | POA: Diagnosis not present

## 2023-03-25 DIAGNOSIS — J3089 Other allergic rhinitis: Secondary | ICD-10-CM

## 2023-03-25 DIAGNOSIS — J302 Other seasonal allergic rhinitis: Secondary | ICD-10-CM

## 2023-03-25 MED ORDER — NEFFY 2 MG/0.1ML NA SOLN: 1.0000 | NASAL | 1 refills | Status: AC | PRN

## 2023-03-25 NOTE — Patient Instructions (Addendum)
 Allergic Rhinitis - Continue avoidance measures for weeds, trees, indoor molds, outdoor molds, dust mites, cat, and tobacco leaf, feathers (ie down) - Use Flonase 2 sprays each nostril daily.  With using nasal sprays point tip of bottle toward eye on same side nostril and lean head slightly forward for best technique.   - Use nasal saline rinse with navage device before use of nasal spray. - Use Zyrtec 10mg  or Allegra 180mg  for more effective symptom control. - Use Montelukast 10mg  daily as it is an anti-leukotriene and works best with consistent use. - Consider allergy shots as a means of long-term control. - Allergy shots "re-train" and "reset" the immune system to ignore environmental allergens and decrease the resulting immune response to those allergens (sneezing, itchy watery eyes, runny nose, nasal congestion, etc).    - Allergy shots improve symptoms in 75-85% of patients.  -We discussed the 2 ways to start allergen immunotherapy including traditional weekly injections versus the RUSH protocol.  He is interested in doing the [facility] protocol.  Discussed premedication regimen for the RUSH protocol as well as need for an epinephrine device for immunotherapy.  He was provided with the insurance codes to ensure coverage and we will get him scheduled for RUSH start.    Potential Mushroom Allergy - Skin testing for mushroom is negative - If interested in eating again we can perform an in-office mushroom challenge if more comfortable performing in office  Follow-up in 6 months or sooner if needed

## 2023-03-25 NOTE — Progress Notes (Signed)
 Follow-up Note  RE: Richard Reyes MRN: [id number] DOB: 08/21/1965 Date of Office Visit: 03/25/2023  History of present illness: Richard Reyes is a 58 y.o. male presenting today for follow-up of allergic rhinitis and mushroom allergy.  He was last seen in the office on 11/25/2022 by myself for skin testing. Discussed the use of AI scribe software for clinical note transcription with the patient, who gave verbal consent to proceed.  He has allergies to various environmental allergens including pollen, molds, dust mites, and animal dander. [place] to these allergens regularly.  He does not want to have to continue to take allergy medicines all the time for symptom control and is interested in allergen immunotherapy. During pollen season, he experiences increased symptoms, including voice changes and drainage. His symptoms have worsened over the past six weeks, coinciding with the onset of tree pollen season. He has been on medications including montelukast and Zyrtec, which provide relief when taken consistently as well as Flonase. However, he wishes to reduce his reliance on daily medications. 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   He avoids mushrooms.  Review of systems: 10pt ROS negative unless noted above in HPI  Past medical/social/surgical/family history have been reviewed and are unchanged unless specifically indicated below.  No changes  Medication List: Current Outpatient Medications  Medication Sig Dispense Refill   albuterol (PROAIR HFA) 108 (90 Base) MCG/ACT inhaler Inhale 2 puffs into the lungs every 4 (four) hours as needed for wheezing or shortness of breath. 1 Inhaler 1   fluticasone (FLONASE) 50 MCG/ACT nasal spray Place 2 sprays into both nostrils daily.     ipratropium (ATROVENT)  0.03 % nasal spray Place 1 spray into both nostrils 3 (three) times daily.     lidocaine-prilocaine (EMLA) cream Apply 1 application topically as needed. 30 to 60 minutes prior to doctor visit 30 g 2   loratadine (CLARITIN) 10 MG tablet Take 1 tablet by mouth daily.     metroNIDAZOLE (METROGEL) 0.75 % gel Apply 1 Application topically 2 (two) times daily.     montelukast (SINGULAIR) 10 MG tablet Take 10 mg by mouth daily.     Multiple Vitamin (MULTIVITAMIN) tablet Take 1 tablet by mouth daily.     omega-3 acid ethyl esters (LOVAZA) 1 g capsule Take by mouth 2 (two) times daily.     tretinoin (RETIN-A) 0.05 % cream Apply topically at bedtime.     clotrimazole-betamethasone (LOTRISONE) cream Apply 1 Application topically 2 (two) times daily. (Patient not taking: Reported on 03/25/2023)     doxycycline (ADOXA) 75 MG tablet Take 75 mg by mouth 2 (two) times daily. (Patient not taking: Reported on 03/25/2023)     linezolid (ZYVOX) 600 MG tablet Take 600 mg by mouth 2 (two) times daily. (Patient not taking: Reported on 03/25/2023)     nystatin cream (MYCOSTATIN) Apply 1 Application topically 3 (three) times daily. (Patient not taking: Reported on 03/25/2023)     testosterone cypionate (DEPOTESTOSTERONE CYPIONATE) 200 MG/ML injection every 14 (fourteen) days. (Patient not taking: Reported on 03/25/2023)     traZODone (DESYREL) 50 MG tablet Take 50 mg by mouth at bedtime as needed. (Patient not taking: Reported on 03/25/2023)     No current facility-administered medications for this visit.     Known medication allergies: Allergies  Allergen Reactions   Sulfonamide Derivatives      Physical examination: Blood pressure 134/76, pulse 63, temperature 98.3 F (36.8 C), temperature source Temporal, height 5\' 9"  (1.753 m), weight 219 lb 8 oz (99.6 kg), SpO2 95%.  General: Alert, interactive, in no acute distress. HEENT: PERRLA, TMs pearly gray, turbinates moderately edematous without discharge, post-pharynx  non erythematous. Neck: Supple without lymphadenopathy. Lungs: Clear to auscultation without wheezing, rhonchi or rales. {no increased work of breathing. CV: Normal S1, S2 without murmurs. Abdomen: Nondistended, nontender. Skin: Warm and dry, without lesions or rashes. Extremities:  No clubbing, cyanosis or edema. Neuro:   Grossly intact.  Diagnositics/Labs: None today  Assessment and plan: Allergic Rhinitis - Continue avoidance measures for weeds, trees, indoor molds, outdoor molds, dust mites, cat, and tobacco leaf, feathers (ie down) - Use Flonase 2 sprays each nostril daily.  With using nasal sprays point tip of bottle toward eye on same side nostril and lean head slightly forward for best technique.   - Use nasal saline rinse with navage device before use of nasal spray. - Use Zyrtec 10mg  or Allegra 180mg  for more effective symptom control. - Use Montelukast 10mg  daily as it is an anti-leukotriene and works best with consistent use. - Consider allergy shots as a means of long-term control. - Allergy shots "re-train" and "reset" the immune system to ignore environmental allergens and decrease the resulting immune response to those allergens (sneezing, itchy watery eyes, runny nose, nasal congestion, etc).    - Allergy shots improve symptoms in 75-85% of patients.  -We discussed the 2 ways to start allergen immunotherapy including traditional weekly injections versus the RUSH protocol.  He is interested in doing the [facility] protocol.  Discussed premedication regimen for the RUSH protocol as well as need for an epinephrine device for immunotherapy.  He was provided with the insurance codes to ensure coverage and we will get him scheduled for RUSH start.    Potential Mushroom Allergy - Skin testing for mushroom is negative - If interested in eating again we can perform an in-office mushroom challenge if more comfortable performing in office  Follow-up in 6 months or sooner if needed  I  appreciate the opportunity to take part in Haston's care. Please do not hesitate to contact me with questions.  Sincerely,   Margo Aye, MD Allergy/Immunology [facility]

## 2023-03-29 ENCOUNTER — Telehealth: Payer: Self-pay

## 2023-03-29 NOTE — Telephone Encounter (Signed)
-----   Message from [employer] Padgett sent at 03/25/2023  7:11 PM EDT ----- Pt wants to start allergy shots by [facility].  Please contact to schedule.

## 2023-04-02 NOTE — Telephone Encounter (Signed)
 Patient called back stating he thought we checked his insurance since we are calling him to schedule.  Patient is going to call his insurance and will not be able to do 04/09/2023.

## 2023-04-02 NOTE — Telephone Encounter (Signed)
 Called and left a voicemail to see if the patient is available on 04/09/23 @ 8:30 to do RUSH with Dr. Allena Katz.

## 2023-05-05 ENCOUNTER — Other Ambulatory Visit: Payer: Self-pay

## 2023-05-05 ENCOUNTER — Encounter: Payer: Self-pay | Admitting: Family Medicine

## 2023-05-05 ENCOUNTER — Ambulatory Visit: Admitting: Family Medicine

## 2023-05-05 ENCOUNTER — Ambulatory Visit (INDEPENDENT_AMBULATORY_CARE_PROVIDER_SITE_OTHER)

## 2023-05-05 VITALS — BP 150/86 | HR 81 | Ht 69.0 in | Wt 209.0 lb

## 2023-05-05 DIAGNOSIS — M25561 Pain in right knee: Secondary | ICD-10-CM

## 2023-05-05 DIAGNOSIS — G8929 Other chronic pain: Secondary | ICD-10-CM

## 2023-05-05 DIAGNOSIS — M79642 Pain in left hand: Secondary | ICD-10-CM

## 2023-05-05 NOTE — Progress Notes (Signed)
 I, Miquel Amen, CMA acting as a scribe for Garlan Juniper, MD.  Richard Reyes is a 58 y.o. male who presents to [facility] at [facility] today for exacerbation of his R knee pain and new R thumb pain. Pt was last seen by Dr. Alease Hunter in 2022.  Today, pt reports left thumb pain, has been treated by Chiro in the past. Denies swelling. Denies current or previous injury. The thumb pops at times. Sx worse when gripping weight.   Pt also c/o chronic right knee pain, has improved some with squats.  Dx imaging: 10/06/20 R knee MRI             09/26/20 R & L knee XR  Pertinent review of systems: No fevers or chills  Relevant historical information: Sleep apnea. Patient lifts weight for exercise and fitness.  Exam:  BP (!) 150/86   Pulse 81   Ht 5\' 9"  (1.753 m)   Wt 209 lb (94.8 kg)   SpO2 96%   BMI 30.86 kg/m  General: Well Developed, well nourished, and in no acute distress.   MSK: Right knee normal-appearing Normal motion. Intact strength. Stable ligamentous exam.  Left hand: Bossing at first [facility].  Tender palpation of this region.  No triggering present with thumb motion.    Lab and Radiology Results  Diagnostic Limited MSK Ultrasound of: Left thumb DJD and effusion present at first [facility]. First MCP relatively normal. Impression: First CMC effusion and degenerative changes seen on ultrasound left hand.   No results found for this or any previous visit (from the past 72 hours). DG Knee AP/LAT W/Sunrise Right Result Date: 05/05/2023 CLINICAL DATA:  Ongoing right knee pain. EXAM: RIGHT KNEE 3 VIEWS COMPARISON:  Right knee radiographs 09/26/2020 FINDINGS: Minimal medial compartment joint space narrowing. Minimal superior and mild lateral patellar facet peripheral degenerative osteophytosis. No joint effusion. No acute fracture or dislocation. IMPRESSION: Minimal medial and patellofemoral compartment osteoarthritis. Electronically Signed   By: Bertina Broccoli M.D.    On: 05/05/2023 22:35   DG Hand Complete Left Result Date: 05/05/2023 CLINICAL DATA:  Left hand and thumb pain. EXAM: LEFT HAND - COMPLETE 3+ VIEW COMPARISON:  Left fifth finger radiographs 02/28/2020 FINDINGS: Neutral ulnar variance. Mild thumb carpometacarpal joint space narrowing, subchondral sclerosis, peripheral osteophytosis. Tiny 2 mm lucent possible erosion at the distal lateral aspect of the middle phalanx of the fifth finger at the DIP joint appears unchanged from 02/28/2020. No acute fracture or dislocation. IMPRESSION: 1. Mild thumb carpometacarpal osteoarthritis. 2. Tiny 2 mm lucent possible erosion at the distal lateral aspect of the middle phalanx of the fifth finger at the DIP joint appears unchanged from 02/28/2020. This is nonspecific but can be seen with early inflammatory arthritis. Electronically Signed   By: Bertina Broccoli M.D.   On: 05/05/2023 22:34  I, Garlan Juniper, personally (independently) visualized and performed the interpretation of the images attached in this note.      Assessment and Plan: 58 y.o. male with left hand and thumb pain due to DJD.  X-ray does show the potential for some erosion which may be a factor.  Consider rheumatologic workup in the future.  I will discuss this with Sherrlyn Dolores.  For now we will proceed to hand therapy which I think will be quite helpful.  Right knee pain due to degenerative changes.  He is doing home exercise program and weight loss which has helped his knee pain quite a bit.  Consider steroid injection in future  if needed.  We talked about safe medication doses and Voltaren gel for both hand and knee. PDMP not reviewed this encounter. Orders Placed This Encounter  Procedures   US  LIMITED JOINT SPACE STRUCTURES LOW RIGHT(NO LINKED CHARGES)    Reason for Exam (SYMPTOM  OR DIAGNOSIS REQUIRED):   right knee pain    Preferred imaging location?:   [facility] Hand Complete Left    Standing Status:   Future     Number of Occurrences:   1    Expiration Date:   06/04/2023    Reason for Exam (SYMPTOM  OR DIAGNOSIS REQUIRED):   left hand/thumb pain    Preferred imaging location?:   [place]   DG Knee AP/LAT W/Sunrise Right    Standing Status:   Future    Number of Occurrences:   1    Expiration Date:   06/04/2023    Reason for Exam (SYMPTOM  OR DIAGNOSIS REQUIRED):   right knee pain    Preferred imaging location?:   [facility]   Ambulatory referral to Occupational Therapy    Referral Priority:   Routine    Referral Type:   Occupational Therapy    Referral Reason:   Specialty Services Required    Requested Specialty:   Occupational Therapy    Number of Visits Requested:   1   No orders of the defined types were placed in this encounter.    Discussed warning signs or symptoms. Please see discharge instructions. Patient expresses understanding.   The above documentation has been reviewed and is accurate and complete Garlan Juniper, M.D.

## 2023-05-05 NOTE — Patient Instructions (Addendum)
 Thank you for coming in today.   Please get an Xray today before you leave   A referral for physical/occupational therapy has been submitted. A representative from the physical/occupational therapy office will contact you to coordinate scheduling after confirming your benefits with your insurance provider. If you do not hear from the physical/occupational therapy office within the next 1-2 weeks, please let us  know.   See you back as needed.

## 2023-05-06 NOTE — Progress Notes (Signed)
Right knee x-ray shows a little bit of arthritis.

## 2023-05-06 NOTE — Progress Notes (Signed)
 Left hand x-ray does show some arthritis at the base of the thumb.  They do also see a possible erosion at the end of the little finger which could be due to rheumatologic conditions.  If you would like we can proceed with a rheumatologic workup with labs.  She would like me to I can order some labs to evaluate for rheumatology problems such as rheumatoid arthritis more thoroughly.

## 2023-05-11 ENCOUNTER — Other Ambulatory Visit: Payer: Self-pay

## 2023-05-11 DIAGNOSIS — M2550 Pain in unspecified joint: Secondary | ICD-10-CM

## 2023-05-11 DIAGNOSIS — M79642 Pain in left hand: Secondary | ICD-10-CM

## 2023-05-11 DIAGNOSIS — R936 Abnormal findings on diagnostic imaging of limbs: Secondary | ICD-10-CM

## 2023-05-14 ENCOUNTER — Other Ambulatory Visit (INDEPENDENT_AMBULATORY_CARE_PROVIDER_SITE_OTHER)

## 2023-05-14 ENCOUNTER — Ambulatory Visit: Admitting: Rehabilitative and Restorative Service Providers"

## 2023-05-14 DIAGNOSIS — R936 Abnormal findings on diagnostic imaging of limbs: Secondary | ICD-10-CM | POA: Diagnosis not present

## 2023-05-14 DIAGNOSIS — M2550 Pain in unspecified joint: Secondary | ICD-10-CM | POA: Diagnosis not present

## 2023-05-14 DIAGNOSIS — M79642 Pain in left hand: Secondary | ICD-10-CM | POA: Diagnosis not present

## 2023-05-14 DIAGNOSIS — M79641 Pain in right hand: Secondary | ICD-10-CM

## 2023-05-14 LAB — SEDIMENTATION RATE: Sed Rate: 12 mm/h (ref 0–20)

## 2023-05-14 NOTE — Therapy (Signed)
 OUTPATIENT OCCUPATIONAL THERAPY ORTHO EVALUATION  Patient Name: Richard Reyes MRN: [id number] DOB:[birth year], 58 y.o., male Today's Date: 05/14/2023  PCP: Manley Seeds MD REFERRING PROVIDER:  Syliva Even, MD    END OF SESSION:  OT End of Session - 05/14/23 1019     Visit Number 1    Number of Visits 5    Date for OT Re-Evaluation 06/25/23    Authorization Type [employer]    OT Start Time 1019    OT Stop Time 1105    OT Time Calculation (min) 46 min    Activity Tolerance Patient tolerated treatment well;No increased pain;Patient limited by pain    Behavior During Therapy [facility] for tasks assessed/performed             Past Medical History:  Diagnosis Date   Allergy     Arthritis    bilat knee    Asthma    exercised induced   Eczema    Hypertension    Insomnia w/ sleep apnea    OSA on CPAP    Rhinitis, allergic    Sleep apnea    wears cpap    Past Surgical History:  Procedure Laterality Date   COLONOSCOPY     HEMORRHOID SURGERY N/A 10/16/2013   Procedure: EXCISION OF EXTERNAL HEMORRHOIDS X 2 AND ANAL EXAM UNDER ANESTHESIA;  Surgeon: Fran Imus, MD;  Location: [facility];  Service: General;  Laterality: N/A;   menuscus     NOSE SURGERY     SINOSCOPY     Patient Active Problem List   Diagnosis Date Noted   Spider vein of right lower extremity 08/03/2019   Chronic venous insufficiency 08/03/2019   Asthma 12/17/2015   Class 2 obesity due to excess calories with serious comorbidity and body mass index (BMI) of 35.0 to 35.9 in adult 12/17/2015   Hepatic steatosis 12/17/2015   S/P hemorrhoidectomy 01/17/2014   Hemorrhoids 11/08/2013   Acne vulgaris 09/27/2013   OSA on CPAP 07/17/2013   Insomnia with sleep apnea 07/17/2013   Hypersomnia 06/02/2013   BACK PAIN, LUMBAR 04/16/2008   OBSTRUCTIVE SLEEP APNEA 11/15/2007   Abnormal levels of other serum enzymes 10/19/2007   DEPRESSION 10/09/2007   TESTOSTERONE  DEFICIENCY 10/06/2007   PURE  HYPERCHOLESTEROLEMIA 10/06/2007   INSOMNIA 10/06/2007    ONSET DATE: Acute on chronic thumb pain, worse in the past few months  REFERRING DIAG: M79.642 (ICD-10-CM) - Left hand pain   THERAPY DIAG:  Pain in left hand  Pain in right hand  Rationale for Evaluation and Treatment: Rehabilitation  SUBJECTIVE:   SUBJECTIVE STATEMENT: He states he is very active, healthy, wears gloves when lifting weights, but still seems to have pain in his left thumb basal joint mainly.  He works in Education officer, environmental, types a lot, has stress in his daily routines.  Also, he will be on vacation for at least a week so he will be able to return to therapy next week.Aaron Aas    PERTINENT HISTORY: "exacerbation of his R knee pain and new R thumb pain. Pt was last seen by Dr. Alease Hunter in 2022. Today, pt reports left thumb pain, has been treated by Chiro in the past. Denies swelling. Denies current or previous injury. The thumb pops at times. Sx worse when gripping weight. Pt also c/o chronic right knee pain, has improved some with squats."  PRECAUTIONS: None  RED FLAGS: None   WEIGHT BEARING RESTRICTIONS: No  PAIN:  Are you having pain? Yes: NPRS scale: 8/10 at  worst in Lt hand thumb  Pain location: Left thumb basal joint Pain description: Aching and sore Aggravating factors: Gripping and bearing weight through thumb Relieving factors: Heat and massage  FALLS: Has patient fallen in last 6 months? No  PLOF: Independent  PATIENT GOALS: To improve pain and ability with left thumb and hand  NEXT MD VISIT: As needed   OBJECTIVE: (All objective assessments below are from initial evaluation on: 05/14/23 unless otherwise specified.)   HAND DOMINANCE: Right   ADLs: Overall ADLs: States decreased ability to grab, hold household objects, pain and difficulty to open containers, perform FMS tasks (manipulate fasteners on clothing   FUNCTIONAL OUTCOME MEASURES: Eval: Quick DASH TBD% impairment today  (Higher % Score  =  More  Impairment)      UPPER EXTREMITY ROM     Shoulder to Wrist AROM Left eval  Wrist flexion 71  Wrist extension 75  (Blank rows = not tested)   Hand AROM Right eval Left eval  Full Fist Ability (or Gap to Distal Palmar Crease)  full  Thumb Opposition  (Kapandji Scale)   10/10  Thumb MCP (0-60)  60  Thumb IP (0-80)  76  Thumb Radial Abduction Span 59  51  Thumb Palmar Abduction Span     (Blank rows = not tested)   HAND FUNCTION: Eval: Observed weakness in affected Lt hand.  Grip strength Right: 84 lbs, Left: 47 lbs  no pain; 78 with pain  COORDINATION: Eval: No significant coordination issues noted in either hand other than pain in the left thumb that is somewhat inhibiting  SENSATION: Eval:  Light touch intact today   EDEMA:   Eval: Mild swelling around the left thumb CMC joint  COGNITION: Eval: Overall cognitive status: WFL for evaluation today   OBSERVATIONS:   Eval: No overt collapse deformity, but MP joint does slightly hyperextend in right hand thumb, CMC joints are somewhat tender and swollen.  Adductor pollicis is somewhat inflamed in the left hand and tight; presents like typical CMC joint thumb arthritis mainly in the left hand   TODAY'S TREATMENT:  Post-evaluation treatment:   For safety//self-care he was given instructions for the management of hand and thumb arthritis which are listed under patient instructions.  These were explained to him today and he states understanding.  Next, he was given the following home exercise program to perform without pain 2 or 3 times a day with accompanying self massage and possibly heat or hot shower.  Each 1 was done carefully with him to ensure a good stretch was felt and no pain was felt.  At the end of the session, he does not have significant relief, however he states not feeling worse than he did at the start of the session.  He did states some of the stretches felt pain relieving.  He was encouraged to do these  things not aggressively and carefully.   Exercises - Seated Wrist Flexion Stretch  - 3 x daily - 3 reps - 15 hold - Wrist Prayer Stretch  - 3 x daily - 3 reps - 15 sec hold - Stretch thumb toward base of small finger (put hand in LAP)  - 2-3 x daily - 3-5 reps - 15 sec hold - Thumb Webspace Stretch  - 3 x daily - 3 reps - 15 sec hold - Thumb PROM Composite Extension  - 2-3 x daily - 3 reps - 15 sec hold - Towel Roll Grip with Forearm in Neutral  -  3 x daily - 5 reps - 10 sec hold    PATIENT EDUCATION: Education details: See tx section above for details  Person educated: Patient Education method: Verbal Instruction, Teach back, Handouts  Education comprehension: States and demonstrates understanding, Additional Education required    HOME EXERCISE PROGRAM: Access Code: [email or web address] URL: [email or web address] Date: 05/14/2023 Prepared by: Leartis Proud   GOALS: Goals reviewed with patient? Yes   SHORT TERM GOALS: (STG required if POC>30 days) Target Date: 05/28/2023  Pt will demo/state understanding of initial HEP to improve pain levels and prerequisite motion. Goal status: INITIAL   LONG TERM GOALS: Target Date: 06/25/23  Pt will improve functional ability by decreased impairment per Quick DASH  assessment from TBD/as needed to TBD/as needed or better, for better quality of life. Goal status: INITIAL  2.  Pt will improve grip strength in left hand from 47 lbs to at least 60 lbs (pain-free grip) for functional use at home and in IADLs. Goal status: INITIAL  3.  Pt will decrease pain at rest/worst from 8/10 to 2/10 or better to have better sleep and occupational participation in daily roles. Goal status: INITIAL   ASSESSMENT:  CLINICAL IMPRESSION: Patient is a 58 y.o. male who was seen today for occupational therapy evaluation for mainly left thumb arthritis at the basal joint with pain, weakness, decreased functional ability.  He will benefit from  outpatient occupational therapy to decrease symptoms and increase quality of life.  He will be away on vacation for a week or 2, so OT will ask for 6 weeks to cover the time span in which she will be gone and not attending therapy.Aaron Aas   PERFORMANCE DEFICITS: in functional skills including IADLs, ROM, strength, pain, fascial restrictions, body mechanics, endurance, decreased knowledge of precautions, and UE functional use, cognitive skills including safety awareness, and psychosocial skills including coping strategies, environmental adaptation, and habits.   IMPAIRMENTS: are limiting patient from ADLs, IADLs, and leisure.   COMORBIDITIES: may have co-morbidities  that affects occupational performance. Patient will benefit from skilled OT to address above impairments and improve overall function.  MODIFICATION OR ASSISTANCE TO COMPLETE EVALUATION: No modification of tasks or assist necessary to complete an evaluation.  OT OCCUPATIONAL PROFILE AND HISTORY: Problem focused assessment: Including review of records relating to presenting problem.  CLINICAL DECISION MAKING: LOW - limited treatment options, no task modification necessary  REHAB POTENTIAL: Excellent  EVALUATION COMPLEXITY: Low      PLAN:  OT FREQUENCY: 1x/week  OT DURATION: 6 weeks through 06/25/23 and up to 5 total visits as needed  PLANNED INTERVENTIONS: 97168 OT Re-evaluation, 97535 self care/ADL training, 54098 therapeutic exercise, 97530 therapeutic activity, 97112 neuromuscular re-education, 97140 manual therapy, 97039 fluidotherapy, 97010 moist heat, 97010 cryotherapy, 97760 Orthotic Initial, 97763 Orthotic/Prosthetic subsequent, compression bandaging, Dry needling, energy conservation, coping strategies training, patient/family education, and DME and/or AE instructions  RECOMMENDED OTHER SERVICES: none now   CONSULTED AND AGREED WITH PLAN OF CARE: Patient  PLAN FOR NEXT SESSION:   Review HEP and initial recommendations     Leartis Proud, OTR/L, CHT 05/14/2023, 12:10 PM

## 2023-05-14 NOTE — Patient Instructions (Signed)

## 2023-05-18 LAB — ANTI-NUCLEAR AB-TITER (ANA TITER): ANA Titer 1: 1:80 {titer} — ABNORMAL HIGH

## 2023-05-18 LAB — HLA-B27 ANTIGEN: HLA-B27 Antigen: NEGATIVE

## 2023-05-18 LAB — ANA: Anti Nuclear Antibody (ANA): POSITIVE — AB

## 2023-05-18 LAB — RHEUMATOID FACTOR: Rheumatoid fact SerPl-aCnc: <10 [IU]/mL (ref <14)

## 2023-05-18 LAB — CYCLIC CITRUL PEPTIDE ANTIBODY, IGG: Cyclic Citrullin Peptide Ab: <16 U

## 2023-05-20 ENCOUNTER — Ambulatory Visit: Payer: Self-pay | Admitting: Family Medicine

## 2023-05-20 NOTE — Progress Notes (Signed)
 Only one of your rheumatologic labs was mildly positive.  Your ANA titer was mildly positive at 1: 80.  I do not think this means much.  Lets see how you do with hand therapy.  If not better we can proceed with a rheumatologic evaluation.

## 2023-06-02 ENCOUNTER — Encounter: Payer: Self-pay | Admitting: Rehabilitative and Restorative Service Providers"

## 2023-06-02 ENCOUNTER — Ambulatory Visit: Admitting: Rehabilitative and Restorative Service Providers"

## 2023-06-02 DIAGNOSIS — M79641 Pain in right hand: Secondary | ICD-10-CM

## 2023-06-02 DIAGNOSIS — M79642 Pain in left hand: Secondary | ICD-10-CM

## 2023-06-02 NOTE — Therapy (Signed)
 OUTPATIENT OCCUPATIONAL THERAPY TREATMENT NOTE  Patient Name: Richard Reyes MRN: [id number] DOB:1965/12/09, 58 y.o., male Today's Date: 06/02/2023  PCP: Manley Seeds MD REFERRING PROVIDER:  Syliva Even, MD    END OF SESSION:  OT End of Session - 06/02/23 0850     Visit Number 2    Number of Visits 5    Date for OT Re-Evaluation 06/25/23    Authorization Type U[employer]    OT Start Time 0851    OT Stop Time 0920    OT Time Calculation (min) 29 min    Activity Tolerance Patient tolerated treatment well;No increased pain;Patient limited by pain    Behavior During Therapy [facility] for tasks assessed/performed              Past Medical History:  Diagnosis Date   Allergy     Arthritis    bilat knee    Asthma    exercised induced   Eczema    Hypertension    Insomnia w/ sleep apnea    OSA on CPAP    Rhinitis, allergic    Sleep apnea    wears cpap    Past Surgical History:  Procedure Laterality Date   COLONOSCOPY     HEMORRHOID SURGERY N/A 10/16/2013   Procedure: EXCISION OF EXTERNAL HEMORRHOIDS X 2 AND ANAL EXAM UNDER ANESTHESIA;  Surgeon: Fran Imus, MD;  Location: [facility];  Service: General;  Laterality: N/A;   menuscus     NOSE SURGERY     SINOSCOPY     Patient Active Problem List   Diagnosis Date Noted   Spider vein of right lower extremity 08/03/2019   Chronic venous insufficiency 08/03/2019   Asthma 12/17/2015   Class 2 obesity due to excess calories with serious comorbidity and body mass index (BMI) of 35.0 to 35.9 in adult 12/17/2015   Hepatic steatosis 12/17/2015   S/P hemorrhoidectomy 01/17/2014   Hemorrhoids 11/08/2013   Acne vulgaris 09/27/2013   OSA on CPAP 07/17/2013   Insomnia with sleep apnea 07/17/2013   Hypersomnia 06/02/2013   BACK PAIN, LUMBAR 04/16/2008   OBSTRUCTIVE SLEEP APNEA 11/15/2007   Abnormal levels of other serum enzymes 10/19/2007   DEPRESSION 10/09/2007   TESTOSTERONE  DEFICIENCY 10/06/2007   PURE  HYPERCHOLESTEROLEMIA 10/06/2007   INSOMNIA 10/06/2007    ONSET DATE: Acute on chronic thumb pain, worse in the past few months  REFERRING DIAG: M79.642 (ICD-10-CM) - Left hand pain   THERAPY DIAG:  Pain in left hand  Pain in right hand  Rationale for Evaluation and Treatment: Rehabilitation   PERTINENT HISTORY: "exacerbation of his R knee pain and new R thumb pain. Pt was last seen by Dr. Alease Hunter in 2022. Today, pt reports left thumb pain, has been treated by Chiro in the past. Denies swelling. Denies current or previous injury. The thumb pops at times. Sx worse when gripping weight. Pt also c/o chronic right knee pain, has improved some with squats."  PRECAUTIONS: None  RED FLAGS: None   WEIGHT BEARING RESTRICTIONS: No    SUBJECTIVE:   SUBJECTIVE STATEMENT: He states he had a good vacation in 2 weeks but his thumb is hurting and he thinks it is because he was doing an exercise incorrectly.    PAIN:  Are you having pain? Yes: NPRS scale: Does not give a rating today but states aching Pain location: Left thumb basal joint Pain description: Aching and sore Aggravating factors: Gripping and bearing weight through thumb Relieving factors: Heat and massage  FALLS: Has patient fallen in last 6 months? No  PLOF: Independent  PATIENT GOALS: To improve pain and ability with left thumb and hand  NEXT MD VISIT: As needed   OBJECTIVE: (All objective assessments below are from initial evaluation on: 05/14/23 unless otherwise specified.)   HAND DOMINANCE: Right   ADLs: Overall ADLs: States decreased ability to grab, hold household objects, pain and difficulty to open containers, perform FMS tasks (manipulate fasteners on clothing   FUNCTIONAL OUTCOME MEASURES: Eval: Quick DASH TBD, PRN impairment today  (Higher % Score  =  More Impairment)      UPPER EXTREMITY ROM     Shoulder to Wrist AROM Left eval  Wrist flexion 71  Wrist extension 75  (Blank rows = not  tested)   Hand AROM Right eval Left eval  Full Fist Ability (or Gap to Distal Palmar Crease)  full  Thumb Opposition  (Kapandji Scale)   10/10  Thumb MCP (0-60)  60  Thumb IP (0-80)  76  Thumb Radial Abduction Span 59  51  Thumb Palmar Abduction Span     (Blank rows = not tested)   HAND FUNCTION: Eval: Observed weakness in affected Lt hand.  Grip strength Right: 84 lbs, Left: 47 lbs  no pain; 78 with pain   OBSERVATIONS:   Eval: No overt collapse deformity, but MP joint does slightly hyperextend in right hand thumb, CMC joints are somewhat tender and swollen.  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  OT then does manual therapy IASTM to loosen the tight and bulky tissues around the thumb especially in the webspace.  He states this feels good and is somewhat relieving.  Next, OT performs his stretches with him to ensure compliance and understanding.  None of these are painful for him, he states understanding how to perform these better now.  Again we discussed safety/self-care, modifying tasks, wearing supportive bracing if possible.  At the end of the session he leaves with no significant pain.   Exercises reviewed today: - Seated Wrist Flexion Stretch  - 3 x daily - 3 reps - 15 hold - Wrist Prayer Stretch  - 3 x daily - 3 reps - 15 sec hold - Stretch thumb toward base of small finger (put hand in LAP)  - 2-3 x daily - 3-5 reps - 15 sec hold - Thumb Webspace Stretch  - 3 x daily - 3 reps - 15 sec hold - Thumb PROM Composite Extension  - 2-3 x daily - 3 reps - 15 sec hold - Towel Roll Grip with Forearm in Neutral  - 3 x daily - 5 reps - 10 sec hold    PATIENT EDUCATION: Education details: See tx section above for details  Person educated:  Patient Education method: Verbal Instruction, Teach back, Handouts  Education comprehension: States and demonstrates understanding, Additional Education required    HOME EXERCISE PROGRAM: Access Code: [email or web address] URL: [email or web address] Date: 05/14/2023 Prepared by: Leartis Proud   GOALS: Goals reviewed with patient? Yes   SHORT TERM GOALS: (STG required if POC>30 days) Target Date: 05/28/2023  Pt will demo/state understanding of initial HEP to improve pain levels and prerequisite motion. Goal status: 06/02/2023: Met   LONG TERM GOALS: Target Date: 06/25/23  Pt will improve  functional ability by decreased impairment per Quick DASH  assessment from TBD/as needed to TBD/as needed or better, for better quality of life. Goal status: INITIAL  2.  Pt will improve grip strength in left hand from 47 lbs to at least 60 lbs (pain-free grip) for functional use at home and in IADLs. Goal status: INITIAL  3.  Pt will decrease pain at rest/worst from 8/10 to 2/10 or better to have better sleep and occupational participation in daily roles. Goal status: INITIAL   ASSESSMENT:  CLINICAL IMPRESSION: 06/02/23: He was doing long stretches incorrectly, but otherwise is doing well, just needs to be consistent and try to not cause pain to himself.      PLAN:  OT FREQUENCY: 1x/week  OT DURATION: 6 weeks through 06/25/23 and up to 5 total visits as needed  PLANNED INTERVENTIONS: 97168 OT Re-evaluation, 97535 self care/ADL training, 44010 therapeutic exercise, 97530 therapeutic activity, 97112 neuromuscular re-education, 97140 manual therapy, 97039 fluidotherapy, 97010 moist heat, 97010 cryotherapy, 97760 Orthotic Initial, 97763 Orthotic/Prosthetic subsequent, compression bandaging, Dry needling, energy conservation, coping strategies training, patient/family education, and DME and/or AE instructions  RECOMMENDED OTHER SERVICES: none now   CONSULTED AND AGREED WITH PLAN OF  CARE: Patient  PLAN FOR NEXT SESSION:   He scheduled a 2-week follow-up, so hopefully he can manage these things well over the next 2 weeks and return with less pain.  We will consider dry needling if the adductor pollicis is still bulky and painful   Leartis Proud, OTR/L, CHT 06/02/2023, 9:32 AM

## 2023-06-11 NOTE — Therapy (Addendum)
 " OUTPATIENT OCCUPATIONAL THERAPY TREATMENT & DISCHARGE NOTE  Patient Name: Richard Reyes MRN: [id number] DOB:08/19/1965, 58 y.o., male Today's Date: 06/15/2023  PCP: Vernadine HAMS MD REFERRING PROVIDER:  Joane Artist RAMAN, MD                     OCCUPATIONAL THERAPY DISCHARGE SUMMARY  Visits from Start of Care: 3  Pt did not show up to additional OT visits and is officially D/C at this point.  Please see notes for any details about status, but unfortunately LTG's could not be finalized.   Melvenia Ada, OTR/L, CHT 01/27/24              END OF SESSION:  OT End of Session - 06/15/23 0844     Visit Number 3    Number of Visits 5    Date for OT Re-Evaluation 06/25/23    Authorization Type UHC    OT Start Time 0847    OT Stop Time 0910    OT Time Calculation (min) 23 min    Activity Tolerance Patient tolerated treatment well;No increased pain;Patient limited by pain    Behavior During Therapy [facility] for tasks assessed/performed             Past Medical History:  Diagnosis Date   Allergy     Arthritis    bilat knee    Asthma    exercised induced   Eczema    Hypertension    Insomnia w/ sleep apnea    OSA on CPAP    Rhinitis, allergic    Sleep apnea    wears cpap    Past Surgical History:  Procedure Laterality Date   COLONOSCOPY     HEMORRHOID SURGERY N/A 10/16/2013   Procedure: EXCISION OF EXTERNAL HEMORRHOIDS X 2 AND ANAL EXAM UNDER ANESTHESIA;  Surgeon: Camellia CHRISTELLA Blush, MD;  Location: [facility];  Service: General;  Laterality: N/A;   menuscus     NOSE SURGERY     SINOSCOPY     Patient Active Problem List   Diagnosis Date Noted   Spider vein of right lower extremity 08/03/2019   Chronic venous insufficiency 08/03/2019   Asthma 12/17/2015   Class 2 obesity due to excess calories with serious comorbidity and body mass index (BMI) of 35.0 to 35.9 in adult 12/17/2015   Hepatic steatosis 12/17/2015   S/P hemorrhoidectomy  01/17/2014   Hemorrhoids 11/08/2013   Acne vulgaris 09/27/2013   OSA on CPAP 07/17/2013   Insomnia with sleep apnea 07/17/2013   Hypersomnia 06/02/2013   BACK PAIN, LUMBAR 04/16/2008   OBSTRUCTIVE SLEEP APNEA 11/15/2007   Abnormal levels of other serum enzymes 10/19/2007   DEPRESSION 10/09/2007   TESTOSTERONE  DEFICIENCY 10/06/2007   PURE HYPERCHOLESTEROLEMIA 10/06/2007   INSOMNIA 10/06/2007    ONSET DATE: Acute on chronic thumb pain, worse in the past few months  REFERRING DIAG: M79.642 (ICD-10-CM) - Left hand pain   THERAPY DIAG:  Pain in left hand  Pain in right hand  Rationale for Evaluation and Treatment: Rehabilitation   PERTINENT HISTORY: exacerbation of his R knee pain and new R thumb pain. Pt was last seen by Dr. Joane in 2022. Today, pt reports left thumb pain, has been treated by Chiro in the past. Denies swelling. Denies current or previous injury. The thumb pops at times. Sx worse when gripping weight. Pt also c/o chronic right knee pain, has improved some with squats.  PRECAUTIONS: None  RED FLAGS: None   WEIGHT  BEARING RESTRICTIONS: No    SUBJECTIVE:   SUBJECTIVE STATEMENT: He states he feels much better with his home exercises now but would like to review them again.  He no longer feels like his thumb is broken but he still has some mild pain at rest and some pain when performing weightlifting.    PAIN:  Are you having pain? Yes: NPRS scale:  1.5/10 at rest  4/10 at worst in past week  Pain location: Left thumb basal joint Pain description: Aching and sore Aggravating factors: Gripping and bearing weight through thumb Relieving factors: Heat and massage  FALLS: Has patient fallen in last 6 months? No  PLOF: Independent  PATIENT GOALS: To improve pain and ability with left thumb and hand  NEXT MD VISIT: As needed   OBJECTIVE: (All objective assessments below are from initial evaluation on: 05/14/23 unless otherwise specified.)   HAND  DOMINANCE: Right   ADLs: Overall ADLs: States decreased ability to grab, hold household objects, pain and difficulty to open containers, perform FMS tasks (manipulate fasteners on clothing   FUNCTIONAL OUTCOME MEASURES: 06/15/23: Quick DASH 13% impairment today  (Higher % Score  =  More Impairment)      UPPER EXTREMITY ROM     Shoulder to Wrist AROM Left eval Lt 06/15/23  Wrist flexion 71 79  Wrist extension 75 82  (Blank rows = not tested)   Hand AROM Right eval Left eval Lt 06/15/23  Full Fist Ability (or Gap to Distal Palmar Crease)  full   Thumb Opposition  (Kapandji Scale)   10/10   Thumb MCP (0-60)  60   Thumb IP (0-80)  76   Thumb Radial Abduction Span 59  51 61  Thumb Palmar Abduction Span      (Blank rows = not tested)   HAND FUNCTION: 06/15/23:  Grip Lt: 47#  without pain, 64# pain point   Eval: Observed weakness in affected Lt hand.  Grip strength Right: 84 lbs, Left: 47 lbs  no pain; 78 with pain   OBSERVATIONS:   Eval: No overt collapse deformity, but MP joint does slightly hyperextend in right hand thumb, CMC joints are somewhat tender and swollen.  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  Grip strength is not significantly improved, though this was not the main goal.  Main goal was to decrease pain and learn the management routine to prevent degeneration and arthritis from worsening.   We review his HEP and he needs some corrections today again.  OT again reviewed safety precautions with weight lifting, recommends wearing compression glove which he now has, recommends modifying activities and using a jar opener to help with opening jars, etc.  He does rate his ability per the QuickDASH today  and the only areas of difficulty are opening jars, weight lifting, and pain on average.  He states he would like to return in 3 or 4 weeks to ensure that he still doing exercises appropriately, and that he is still moving well and doing well.   Exercises reviewed today: - Seated Wrist Flexion Stretch  - 3 x daily - 3 reps - 15 hold - Wrist Prayer Stretch  - 3 x daily - 3 reps - 15 sec hold - Stretch  thumb toward base of small finger (put hand in LAP)  - 2-3 x daily - 3-5 reps - 15 sec hold - Thumb Webspace Stretch  - 3 x daily - 3 reps - 15 sec hold - Thumb PROM Composite Extension  - 2-3 x daily - 3 reps - 15 sec hold - Towel Roll Grip with Forearm in Neutral  - 3 x daily - 5 reps - 10 sec hold   PATIENT EDUCATION: Education details: See tx section above for details  Person educated: Patient Education method: Verbal Instruction, Teach back, Handouts  Education comprehension: States and demonstrates understanding   HOME EXERCISE PROGRAM: Access Code: [email or web address] URL: [email or web address] Date: 05/14/2023 Prepared by: Melvenia Ada   GOALS: Goals reviewed with patient? Yes   SHORT TERM GOALS: (STG required if POC>30 days) Target Date: 05/28/2023  Pt will demo/state understanding of initial HEP to improve pain levels and prerequisite motion. Goal status: 06/02/2023: Met   LONG TERM GOALS: Target Date: 06/25/23  Pt will improve functional ability by decreased impairment per Quick DASH  assessment from TBD/as needed to TBD/as needed or better, for better quality of life. Goal status: INITIAL  2.  Pt will improve grip strength in left hand from 47 lbs to at least 60 lbs (pain-free grip) for functional use at home and in IADLs. Goal status: INITIAL  3.  Pt will decrease pain at rest/worst from 8/10 to 2/10 or better to have better sleep and occupational participation in daily roles. Goal status: INITIAL   ASSESSMENT:  CLINICAL IMPRESSION: 06/15/23: He is  doing much better overall, though he still has some weakness of grip and difficulty with opening jars.  He will return as needed in 3 to 4 weeks to check his status and likely discharge at that point.    PLAN:  OT FREQUENCY: 1x/week  OT DURATION: 6 weeks through 06/25/23 and up to 5 total visits as needed  PLANNED INTERVENTIONS: 97168 OT Re-evaluation, 97535 self care/ADL training, 02889 therapeutic exercise, 97530 therapeutic activity, 97112 neuromuscular re-education, 97140 manual therapy, 97039 fluidotherapy, 97010 moist heat, 97010 cryotherapy, 97760 Orthotic Initial, 97763 Orthotic/Prosthetic subsequent, compression bandaging, Dry needling, energy conservation, coping strategies training, patient/family education, and DME and/or AE instructions  RECOMMENDED OTHER SERVICES: none now   CONSULTED AND AGREED WITH PLAN OF CARE: Patient  PLAN FOR NEXT SESSION:   Follow-up with the patient as needed over the next 3 to 4 weeks to ensure that he is doing home exercises well  Melvenia Ada, OTR/L, CHT 06/15/2023, 9:16 AM   "

## 2023-06-15 ENCOUNTER — Encounter: Payer: Self-pay | Admitting: Rehabilitative and Restorative Service Providers"

## 2023-06-15 ENCOUNTER — Ambulatory Visit: Admitting: Rehabilitative and Restorative Service Providers"

## 2023-06-15 DIAGNOSIS — M79642 Pain in left hand: Secondary | ICD-10-CM

## 2023-06-15 DIAGNOSIS — M79641 Pain in right hand: Secondary | ICD-10-CM

## 2023-06-23 ENCOUNTER — Encounter: Admitting: Rehabilitative and Restorative Service Providers"

## 2023-06-28 NOTE — Therapy (Incomplete)
 OUTPATIENT OCCUPATIONAL THERAPY TREATMENT & ***  NOTE  Patient Name: Richard Reyes MRN: [id number] DOB:02/07/1965, 58 y.o., male 54 Date: 06/28/2023  PCP: Vernadine HAMS MD REFERRING PROVIDER:  Joane Artist RAMAN, MD               ***                END OF SESSION:    Past Medical History:  Diagnosis Date   Allergy     Arthritis    bilat knee    Asthma    exercised induced   Eczema    Hypertension    Insomnia w/ sleep apnea    OSA on CPAP    Rhinitis, allergic    Sleep apnea    wears cpap    Past Surgical History:  Procedure Laterality Date   COLONOSCOPY     HEMORRHOID SURGERY N/A 10/16/2013   Procedure: EXCISION OF EXTERNAL HEMORRHOIDS X 2 AND ANAL EXAM UNDER ANESTHESIA;  Surgeon: Camellia CHRISTELLA Blush, MD;  Location: [facility];  Service: General;  Laterality: N/A;   menuscus     NOSE SURGERY     SINOSCOPY     Patient Active Problem List   Diagnosis Date Noted   Spider vein of right lower extremity 08/03/2019   Chronic venous insufficiency 08/03/2019   Asthma 12/17/2015   Class 2 obesity due to excess calories with serious comorbidity and body mass index (BMI) of 35.0 to 35.9 in adult 12/17/2015   Hepatic steatosis 12/17/2015   S/P hemorrhoidectomy 01/17/2014   Hemorrhoids 11/08/2013   Acne vulgaris 09/27/2013   OSA on CPAP 07/17/2013   Insomnia with sleep apnea 07/17/2013   Hypersomnia 06/02/2013   BACK PAIN, LUMBAR 04/16/2008   OBSTRUCTIVE SLEEP APNEA 11/15/2007   Abnormal levels of other serum enzymes 10/19/2007   DEPRESSION 10/09/2007   TESTOSTERONE  DEFICIENCY 10/06/2007   PURE HYPERCHOLESTEROLEMIA 10/06/2007   INSOMNIA 10/06/2007    ONSET DATE: Acute on chronic thumb pain, worse in the past few months  REFERRING DIAG: M79.642 (ICD-10-CM) - Left hand pain   THERAPY DIAG:  No diagnosis found.  Rationale for Evaluation and Treatment: Rehabilitation   PERTINENT HISTORY: exacerbation of his R knee pain and new R  thumb pain. Pt was last seen by Dr. Joane in 2022. Today, pt reports left thumb pain, has been treated by Chiro in the past. Denies swelling. Denies current or previous injury. The thumb pops at times. Sx worse when gripping weight. Pt also c/o chronic right knee pain, has improved some with squats.  PRECAUTIONS: None  RED FLAGS: None;  WEIGHT BEARING RESTRICTIONS: No    SUBJECTIVE:   SUBJECTIVE STATEMENT: He states his management over the past 2 weeks  ***   he feels much better with his home exercises now but would like to review them again.  He no longer feels like his thumb is broken but he still has some mild pain at rest and some pain when performing weightlifting.    PAIN:  Are you having pain? Yes: NPRS scale:  *** 1.5/10 at rest  *** 4/10 at worst in past week  Pain location: Left thumb basal joint Pain description: Aching and sore Aggravating factors: Gripping and bearing weight through thumb Relieving factors: Heat and massage   PATIENT GOALS: To improve pain and ability with left thumb and hand  NEXT MD VISIT: As needed   OBJECTIVE: (All objective assessments below are from initial evaluation on: 05/14/23 unless otherwise specified.)  HAND DOMINANCE: Right   ADLs: Overall ADLs: States decreased ability to grab, hold household objects, pain and difficulty to open containers, perform FMS tasks (manipulate fasteners on clothing   FUNCTIONAL OUTCOME MEASURES: 06/30/23: Quick DASH: ***% today    06/15/23: Quick DASH 13% impairment today  (Higher % Score  =  More Impairment)      UPPER EXTREMITY ROM     Shoulder to Wrist AROM Left eval Lt 06/15/23 Lt 06/30/23  Wrist flexion 71 79 ***  Wrist extension 75 82 ***  (Blank rows = not tested)   Hand AROM Right eval Left eval Lt 06/15/23 Lt 06/30/23  Full Fist Ability (or Gap to Distal Palmar Crease)  full    Thumb Opposition  (Kapandji Scale)   10/10    Thumb MCP (0-60)  60  ***  Thumb IP (0-80)  76  ***   Thumb Radial Abduction Span 59  51 61 ***  Thumb Palmar Abduction Span       (Blank rows = not tested)   HAND FUNCTION: 06/30/23: Grip Lt: ***#    06/15/23:  Grip Lt: 47#  without pain, 64# pain point   Eval: Observed weakness in affected Lt hand.  Grip strength Right: 84 lbs, Left: 47 lbs  no pain; 78 with pain   OBSERVATIONS:   Eval: No overt collapse deformity, but MP joint does slightly hyperextend in right hand thumb, CMC joints are somewhat tender and swollen.  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  Grip strength is not significantly improved, though this was not the main goal.  Main goal was to decrease pain and learn the management routine to prevent degeneration and arthritis from worsening.   We review his HEP and he needs some corrections today again.  OT again reviewed safety precautions with weight lifting, recommends wearing compression glove which he now has, recommends modifying activities and using a jar opener to help with opening jars, etc.  He does rate his ability per the QuickDASH today and the only areas of difficulty are opening jars, weight lifting, and pain on average.  He states he would like to return in 3 or 4 weeks to ensure that he still doing exercises appropriately, and that he is still moving well and doing well.   Exercises reviewed today: - Seated Wrist Flexion Stretch  - 3 x daily - 3 reps - 15 hold - Wrist Prayer Stretch  - 3 x daily - 3 reps - 15 sec hold - Stretch thumb toward base of small finger (put hand in LAP)  - 2-3 x daily - 3-5 reps - 15 sec  hold - Thumb Webspace Stretch  - 3 x daily - 3 reps - 15 sec hold - Thumb PROM Composite Extension  - 2-3 x daily - 3 reps - 15 sec hold - Towel Roll Grip with Forearm in Neutral  - 3 x daily - 5 reps - 10 sec hold   PATIENT EDUCATION: Education details: See tx section above for details  Person educated: Patient Education method: Verbal Instruction, Teach back, Handouts  Education comprehension: States and demonstrates understanding   HOME EXERCISE PROGRAM: Access Code: [email or web address] URL: [email or web address] Date: 05/14/2023 Prepared by: Melvenia Ada   GOALS: Goals reviewed with patient? Yes   SHORT TERM GOALS: (STG required if POC>30 days) Target Date: 05/28/2023  Pt will demo/state understanding of initial HEP to improve pain levels and prerequisite motion. Goal status: 06/02/2023: Met   LONG TERM GOALS: Target Date: 06/25/23  Pt will improve functional ability by decreased impairment per Quick DASH  assessment from TBD/as needed to TBD/as needed or better, for better quality of life. Goal status: 06/30/23: ***  2.  Pt will improve grip strength in left hand from 47 lbs to at least 60 lbs (pain-free grip) for functional use at home and in IADLs. Goal status: 06/30/23: ***  3.  Pt will decrease pain at rest/worst from 8/10 to 2/10 or better to have better sleep and occupational participation in daily roles. Goal status: 06/30/23: ***   ASSESSMENT:  CLINICAL IMPRESSION: 06/30/23: ***  06/15/23: He is doing much better overall, though he still has some weakness of grip and difficulty with opening jars.  He will return as needed in 3 to 4 weeks to check his status and likely discharge at that point.    PLAN:  OT FREQUENCY: 1x/week  OT DURATION: 1 additional week, from 06/25/23 - 06/30/23  *** and up to 4 total visits as needed  PLANNED INTERVENTIONS: 97168 OT Re-evaluation, 97535 self care/ADL training, 02889 therapeutic exercise, 97530 therapeutic  activity, 97112 neuromuscular re-education, 97140 manual therapy, 97039 fluidotherapy, 97010 moist heat, 97010 cryotherapy, 97760 Orthotic Initial, 97763 Orthotic/Prosthetic subsequent, compression bandaging, Dry needling, energy conservation, coping strategies training, patient/family education, and DME and/or AE instructions  RECOMMENDED OTHER SERVICES: none now   CONSULTED AND AGREED WITH PLAN OF CARE: Patient  PLAN FOR NEXT SESSION:   ***  Melvenia Ada, OTR/L, CHT 06/28/2023, 4:58 PM

## 2023-06-30 ENCOUNTER — Encounter: Admitting: Rehabilitative and Restorative Service Providers"

## 2023-07-16 ENCOUNTER — Telehealth: Payer: Self-pay

## 2023-07-16 NOTE — Telephone Encounter (Signed)
 LVM informing patient that we had [facility] availability on 7/18 and to call us  back if that date works.

## 2023-07-26 ENCOUNTER — Ambulatory Visit: Admitting: Podiatry

## 2023-07-26 ENCOUNTER — Ambulatory Visit (INDEPENDENT_AMBULATORY_CARE_PROVIDER_SITE_OTHER)

## 2023-07-26 ENCOUNTER — Encounter: Payer: Self-pay | Admitting: Podiatry

## 2023-07-26 VITALS — Ht 69.0 in | Wt 209.0 lb

## 2023-07-26 DIAGNOSIS — M67471 Ganglion, right ankle and foot: Secondary | ICD-10-CM

## 2023-07-26 DIAGNOSIS — D3613 Benign neoplasm of peripheral nerves and autonomic nervous system of lower limb, including hip: Secondary | ICD-10-CM | POA: Diagnosis not present

## 2023-07-26 NOTE — Progress Notes (Signed)
   Chief Complaint  Patient presents with   Neuroma    Pt is here due to neuroma on top of the right foot states he dropped something on his foot a few months ago, bump formed, he thought it would go away, but as of today it is still there , he states it is sometimes painful.    HPI: 58 y.o. male presenting today as a new patient for evaluation of a lesion to the right foot.  Patient states that a few months ago he dropped something on his foot and immediately developed a bump.  It would not go away.  It is intermittently symptomatic.  Past Medical History:  Diagnosis Date   Allergy     Arthritis    bilat knee    Asthma    exercised induced   Eczema    Hypertension    Insomnia w/ sleep apnea    OSA on CPAP    Rhinitis, allergic    Sleep apnea    wears cpap     Past Surgical History:  Procedure Laterality Date   COLONOSCOPY     HEMORRHOID SURGERY N/A 10/16/2013   Procedure: EXCISION OF EXTERNAL HEMORRHOIDS X 2 AND ANAL EXAM UNDER ANESTHESIA;  Surgeon: Camellia CHRISTELLA Blush, MD;  Location: [facility];  Service: General;  Laterality: N/A;   menuscus     NOSE SURGERY     SINOSCOPY      Allergies  Allergen Reactions   Sulfonamide Derivatives      Physical Exam: General: The patient is alert and oriented x3 in no acute distress.  Dermatology: Skin is warm, dry and supple bilateral lower extremities.   Vascular: Palpable pedal pulses bilaterally. Capillary refill within normal limits.  No appreciable edema.  No erythema.  Neurological: Grossly intact via light touch  Musculoskeletal Exam: Not adhered nodular lesion noted within the subcutaneous tissue to the first interspace of the right foot consistent with a ganglion cyst  Radiographic Exam RT foot 07/26/2023:  Normal osseous mineralization. Joint spaces preserved.  No fractures or osseous irregularities noted.  There is some radiopacity to the first interspace of the right foot at the location of the ganglion  cyst  Assessment/Plan of Care: 1.  Ganglion cyst first interspace right foot  -Patient evaluated.  X-rays reviewed -Quick pressure was applied to the ganglion cyst and it was ruptured and the fluid was resorbed in the surrounding tissue.  Patient tolerated this well without any pain -Recommend daily massage to prevent recurrence -Recommend good supportive tennis shoes and sneakers -Return to clinic PRN       Thresa EMERSON Sar, DPM [facility]  Dr. Thresa EMERSON Sar, DPM    [address] [place], [place] [address]                Office [phone]  Fax [phone]

## 2023-09-23 ENCOUNTER — Ambulatory Visit: Admitting: Allergy

## 2023-09-23 IMAGING — MR MR KNEE*R* W/O CM
7 series · 40 of 40 positions shown · non-contrast
Comparison: 11/29/2017

CLINICAL DATA: Chronic right knee pain over the last 2 years. Knee
surgery 3 years ago.

EXAM:
MRI OF THE RIGHT KNEE WITHOUT CONTRAST
TECHNIQUE: Multiplanar, multisequence MR imaging of the knee was performed. No
intravenous contrast was administered.

[Series 3: T2 fat-sat · axial · 4.0mm · 0.50mm/px · z∈[-68,+101]mm · 6 of 35 slices shown (1 of 3)]
[im 1/35]
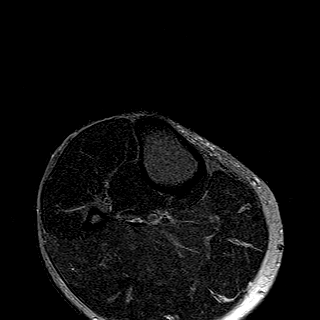
[im 7/35]
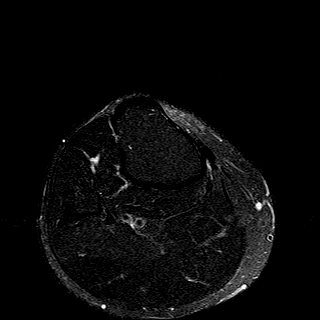
[im 14/35]
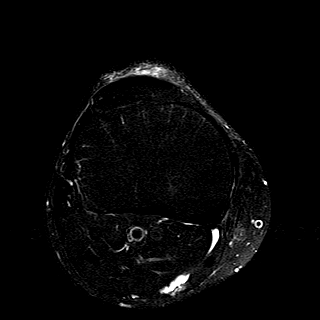
[im 21/35]
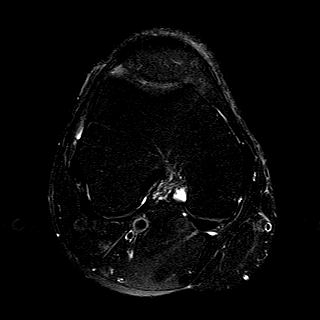
[im 28/35]
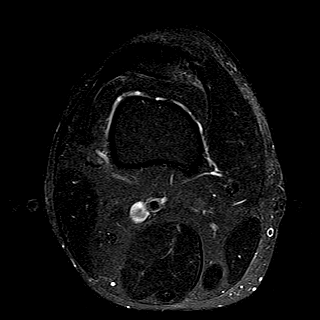
[im 35/35]
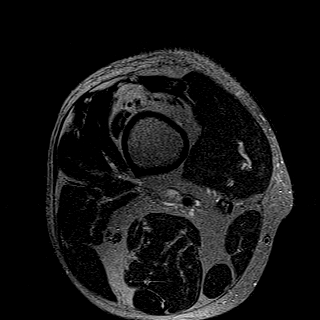

[Series 4: T1 · coronal · 4.0mm · 0.59mm/px · 6 of 31 slices shown]
[im 1/31]
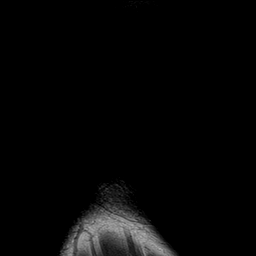
[im 7/31]
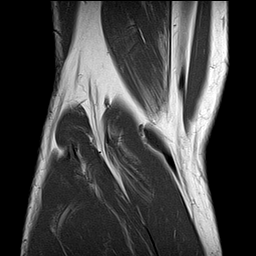
[im 13/31]
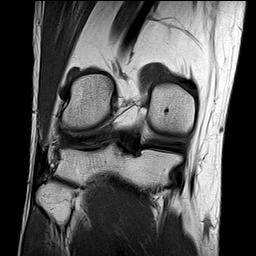
[im 19/31]
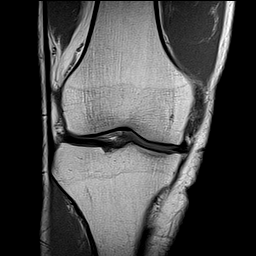
[im 25/31]
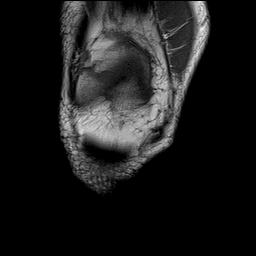
[im 31/31]
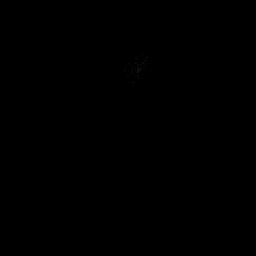

[Series 5: T2 fat-sat · coronal · 4.0mm · 0.59mm/px · 6 of 31 slices shown (2 of 3)]
[im 1/31]
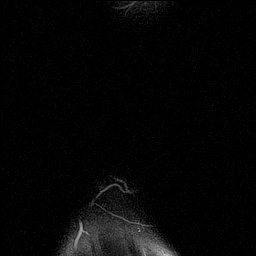
[im 7/31]
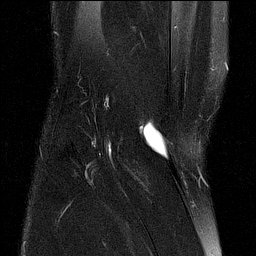
[im 13/31]
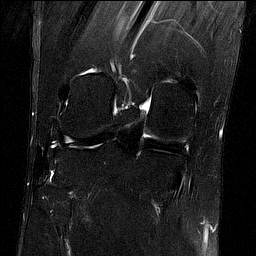
[im 19/31]
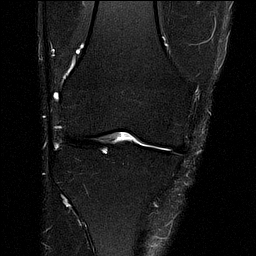
[im 25/31]
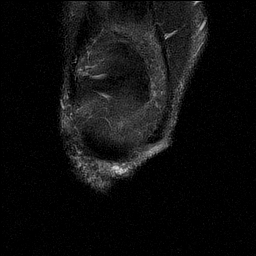
[im 31/31]
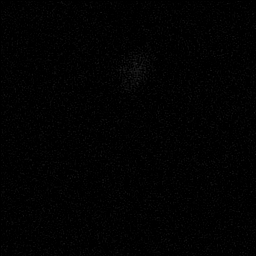

[Series 6: PD fat-sat · coronal · 4.0mm · 0.59mm/px · 6 of 31 slices shown (1 of 3)]
[im 1/31]
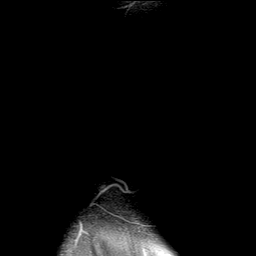
[im 7/31]
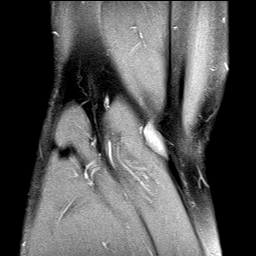
[im 13/31]
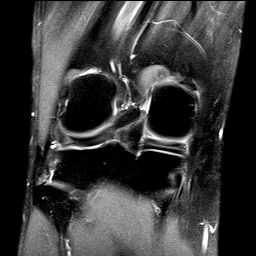
[im 19/31]
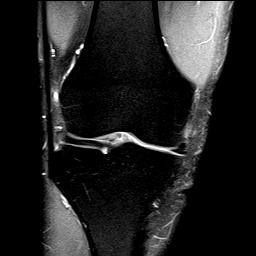
[im 25/31]
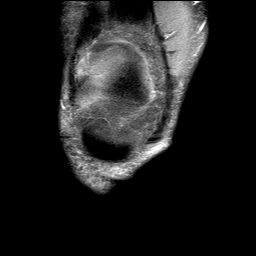
[im 31/31]
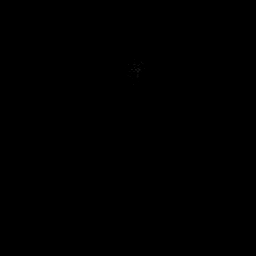

[Series 7: PD fat-sat · sagittal · 3.0mm · 0.59mm/px · 6 of 35 slices shown (2 of 3)]
[im 1/35]
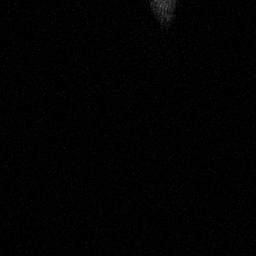
[im 7/35]
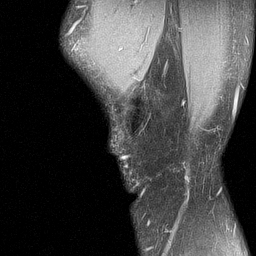
[im 14/35]
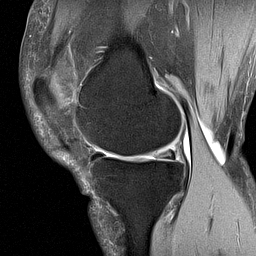
[im 21/35]
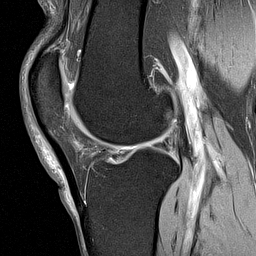
[im 28/35]
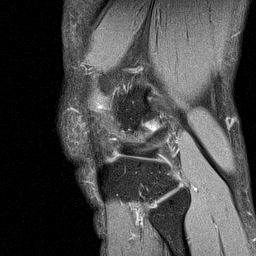
[im 35/35]
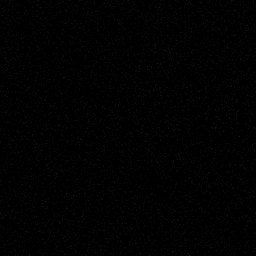

[Series 8: T2 fat-sat · sagittal · 3.0mm · 0.59mm/px · 6 of 35 slices shown (3 of 3)]
[im 1/35]
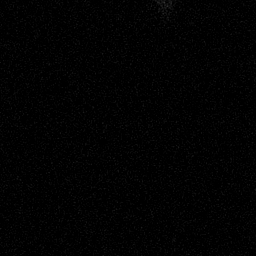
[im 7/35]
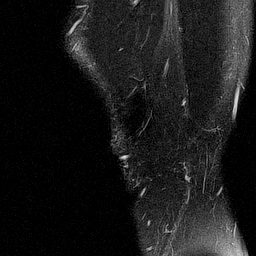
[im 14/35]
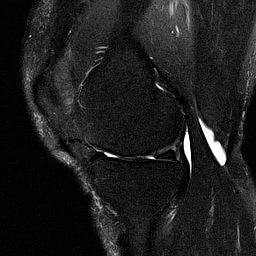
[im 21/35]
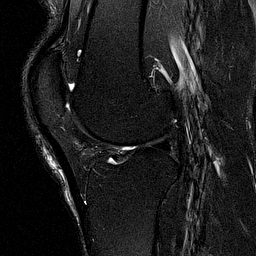
[im 28/35]
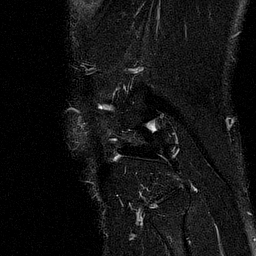
[im 35/35]
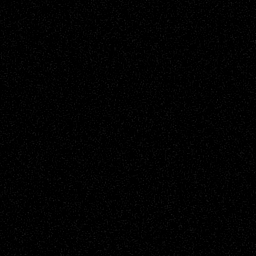

[Series 9: PD fat-sat · coronal · 2.0mm · 0.59mm/px · 4 of 19 slices shown (3 of 3)]
[im 1/19]
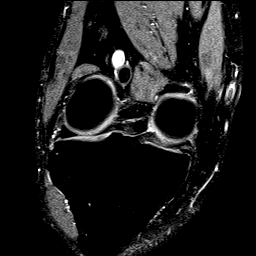
[im 7/19]
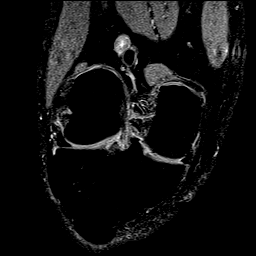
[im 13/19]
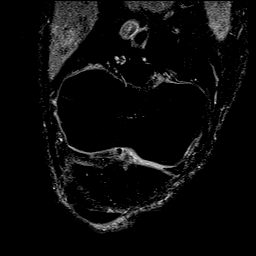
[im 19/19]
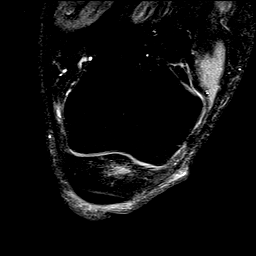

[40 of 40 positions shown; findings below may reference images not displayed]

FINDINGS: MENISCI

Medial meniscus: Stable grade 3 oblique signal in the posterior horn
medial meniscus extending to the inferior meniscal surface, the
patient has reportedly had prior debridement and this may well
represent postoperative signal rather than recurrent tear. This is
not substantially changed from 11/29/2017.

Lateral meniscus:  Unremarkable

LIGAMENTS

Cruciates:  Unremarkable

Collaterals:  Unremarkable

CARTILAGE

Patellofemoral:  Mild chondral heterogeneity along the patella.

Medial:  Moderate degenerative chondral thinning.

Lateral:  Mild degenerative chondral thinning.

Joint: Mild localized synovitis anterior to the transverse meniscal
ligament for example on image 17 series 8, new from 11/29/2017,
infrapatellar plica injury is a possibility.

Popliteal Fossa:  Moderate size Baker's cyst.

Extensor Mechanism:  Mild prepatellar subcutaneous edema.

Bones: No significant extra-articular osseous abnormalities
identified.

Other: No supplemental non-categorized findings.
IMPRESSION: 1. New focal synovitis anterior to the transverse meniscal ligament
and somewhat continuous with the infrapatellar plica. This could
reflect infrapatellar plical injury.
2. Moderate-sized Baker's cyst.
3. Generally mild osteoarthritis.
4. Continued oblique grade 3 signal in the posterior horn medial
meniscus extending to the inferior surface, the patient has had
prior debridement and the appearance is unchanged from prior, and
probably postoperative signal rather than recurrent tear.
